# Patient Record
Sex: Female | Born: 1990 | Race: White | Hispanic: No | Marital: Single | Smoking: Never smoker
Health system: Southern US, Community
[De-identification: ages and names within clinical notes are randomized; demographics above are authoritative.]

## PROBLEM LIST (undated history)

## (undated) DIAGNOSIS — T50902A Poisoning by unspecified drugs, medicaments and biological substances, intentional self-harm, initial encounter: Secondary | ICD-10-CM

## (undated) DIAGNOSIS — L309 Dermatitis, unspecified: Secondary | ICD-10-CM

## (undated) DIAGNOSIS — G43909 Migraine, unspecified, not intractable, without status migrainosus: Secondary | ICD-10-CM

## (undated) DIAGNOSIS — F909 Attention-deficit hyperactivity disorder, unspecified type: Secondary | ICD-10-CM

## (undated) DIAGNOSIS — T7840XA Allergy, unspecified, initial encounter: Secondary | ICD-10-CM

## (undated) HISTORY — DX: Attention-deficit hyperactivity disorder, unspecified type: F90.9

## (undated) HISTORY — PX: TONSILLECTOMY: SUR1361

## (undated) HISTORY — PX: WISDOM TOOTH EXTRACTION: SHX21

## (undated) HISTORY — DX: Migraine, unspecified, not intractable, without status migrainosus: G43.909

## (undated) HISTORY — DX: Poisoning by unspecified drugs, medicaments and biological substances, intentional self-harm, initial encounter: T50.902A

## (undated) HISTORY — DX: Allergy, unspecified, initial encounter: T78.40XA

## (undated) HISTORY — DX: Dermatitis, unspecified: L30.9

---
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
## 2009-07-22 ENCOUNTER — Ambulatory Visit (HOSPITAL_BASED_OUTPATIENT_CLINIC_OR_DEPARTMENT_OTHER): Admission: RE | Admit: 2009-07-22 | Discharge: 2009-07-22 | Payer: Self-pay | Admitting: Oral Surgery

## 2013-02-15 ENCOUNTER — Ambulatory Visit (INDEPENDENT_AMBULATORY_CARE_PROVIDER_SITE_OTHER): Payer: 59 | Admitting: Family Medicine

## 2013-02-15 ENCOUNTER — Encounter: Payer: Self-pay | Admitting: Family Medicine

## 2013-02-15 ENCOUNTER — Telehealth: Payer: Self-pay | Admitting: Family Medicine

## 2013-02-15 VITALS — BP 100/68 | Temp 98.6°F | Ht 67.0 in | Wt 144.4 lb

## 2013-02-15 DIAGNOSIS — Z889 Allergy status to unspecified drugs, medicaments and biological substances status: Secondary | ICD-10-CM

## 2013-02-15 DIAGNOSIS — Z9109 Other allergy status, other than to drugs and biological substances: Secondary | ICD-10-CM

## 2013-02-15 DIAGNOSIS — G43109 Migraine with aura, not intractable, without status migrainosus: Secondary | ICD-10-CM

## 2013-02-15 MED ORDER — METHYLPREDNISOLONE ACETATE 40 MG/ML IJ SUSP
40.0000 mg | Freq: Once | INTRAMUSCULAR | Status: AC
  Administered 2013-02-15: 40 mg via INTRAMUSCULAR

## 2013-02-15 MED ORDER — SUMATRIPTAN SUCCINATE 50 MG PO TABS: 50.0000 mg | ORAL_TABLET | Freq: Once | ORAL | Status: DC | PRN

## 2013-02-15 MED ORDER — BUTALBITAL-APAP-CAFFEINE 50-325-40 MG PO TABS: 1.0000 | ORAL_TABLET | Freq: Four times a day (QID) | ORAL | Status: DC | PRN

## 2013-02-15 MED ORDER — ONDANSETRON HCL 4 MG PO TABS: 4.0000 mg | ORAL_TABLET | Freq: Every day | ORAL | Status: AC | PRN

## 2013-02-15 NOTE — Telephone Encounter (Signed)
DR Brett Canales refilled the wrong med for pt from her visit today with him, she needs Butal 50-325-40 Acetametaphon Caff called into [facility]

## 2013-02-15 NOTE — Progress Notes (Signed)
  Subjective:    Patient ID: Stephanie Mercado, female    DOB: 1991/07/18, 22 y.o.   MRN: [id number]  Migraine  This is a recurrent problem. The current episode started more than 1 year ago. The problem has been waxing and waning. The pain quality is similar to prior headaches. The quality of the pain is described as pulsating. The pain is at a severity of 6/10. The pain is moderate. Associated symptoms include nausea, phonophobia, photophobia, a visual change and vomiting. The symptoms are aggravated by activity.   Patient also notes recent allergy symptoms congestion drainage and stuffiness. Only fair response antihistamines. States an injection of steroids always helps her. And requests this.   Review of Systems  Eyes: Positive for photophobia.  Gastrointestinal: Positive for nausea and vomiting.       Objective:   Physical Exam Alert no acute distress. HEENT mild nasal congestion. Vital signs reviewed. Lungs clear. Heart regular rate and rhythm.       Assessment & Plan:  Impression #1 intermittent migraine headaches. Discussed. Very sporadic in nature. Response to medicine. #2 allergic rhinitis. Plan I initially asked if the patient wanted a refill on her Imitrex and she said yes. Later she called and stated she really meant the butalbital. This was also refilled. Steroid injection given. WSL

## 2013-02-15 NOTE — Telephone Encounter (Signed)
I asked pt if imitrex and she said yes. rx this med (butalb/acet/caffeine)with num 24, one po q4-6 prn headache. One refill

## 2013-02-28 ENCOUNTER — Telehealth: Payer: Self-pay | Admitting: Family Medicine

## 2013-02-28 NOTE — Telephone Encounter (Signed)
Pt wants to know if she could get some Allergy eye drops prescribed due to the pollen making her eyes itchy, watery and swell up.  Or does she need to go get something OTC?

## 2013-03-01 NOTE — Telephone Encounter (Signed)
Discussed with patient

## 2013-03-01 NOTE — Telephone Encounter (Signed)
It will probably just as cheap to buy Zaditor OTC.  It comes generic and works just as good as Rx meds.

## 2013-03-07 ENCOUNTER — Encounter: Payer: Self-pay | Admitting: *Deleted

## 2013-03-08 ENCOUNTER — Encounter: Payer: 59 | Admitting: Family Medicine

## 2013-09-20 ENCOUNTER — Other Ambulatory Visit: Payer: Self-pay | Admitting: Family Medicine

## 2013-09-20 NOTE — Telephone Encounter (Signed)
Ok may ref times one 

## 2013-12-09 ENCOUNTER — Other Ambulatory Visit: Payer: Self-pay | Admitting: Family Medicine

## 2013-12-09 NOTE — Telephone Encounter (Signed)
It is wise not to use these frequently. May have one refill. Will need followup office visit before further refills.

## 2014-03-31 ENCOUNTER — Encounter: Payer: Self-pay | Admitting: Family Medicine

## 2014-03-31 ENCOUNTER — Ambulatory Visit (INDEPENDENT_AMBULATORY_CARE_PROVIDER_SITE_OTHER): Payer: 59 | Admitting: Family Medicine

## 2014-03-31 VITALS — BP 120/74 | Ht 68.0 in | Wt 143.0 lb

## 2014-03-31 DIAGNOSIS — J309 Allergic rhinitis, unspecified: Secondary | ICD-10-CM | POA: Insufficient documentation

## 2014-03-31 DIAGNOSIS — J302 Other seasonal allergic rhinitis: Secondary | ICD-10-CM

## 2014-03-31 DIAGNOSIS — F988 Other specified behavioral and emotional disorders with onset usually occurring in childhood and adolescence: Secondary | ICD-10-CM | POA: Insufficient documentation

## 2014-03-31 MED ORDER — BUTALBITAL-APAP-CAFFEINE 50-325-40 MG PO TABS: ORAL_TABLET | ORAL | Status: DC

## 2014-03-31 MED ORDER — METHYLPREDNISOLONE ACETATE 40 MG/ML IJ SUSP
40.0000 mg | Freq: Once | INTRAMUSCULAR | Status: AC
  Administered 2014-03-31: 40 mg via INTRAMUSCULAR

## 2014-03-31 MED ORDER — AMPHETAMINE-DEXTROAMPHET ER 10 MG PO CP24: 10.0000 mg | ORAL_CAPSULE | Freq: Every day | ORAL | Status: DC

## 2014-03-31 NOTE — Progress Notes (Signed)
   Subjective:    Patient ID: Stephanie BookbinderLeah J Mercado, female    DOB: 19-Aug-1991, 23 y.o.   MRN: [id number]  HPIConcerns about ADD. Patient states she has trouble concertrating and being organized. Affecting her school work. This started years ago. Has never been diagnosed with ADD.   Requesting shot for allergies. Sneezing, congestion, itchy, and watery eyes.  Patient's had some problems with focus and ADD issues for a length of time it's been more pronounced recently since being in college. Has difficult time staying on track often finds herself jumping from one thing to do next and rarely finishing what she starts.    Review of Systems  Constitutional: Negative for fever, activity change, appetite change and fatigue.  HENT: Positive for rhinorrhea. Negative for congestion and ear pain.   Eyes: Negative for discharge.  Respiratory: Negative for cough, shortness of breath and wheezing.   Cardiovascular: Negative for chest pain.  Gastrointestinal: Negative for abdominal pain.  Neurological: Negative for headaches.  Psychiatric/Behavioral: Negative for behavioral problems.       Objective:   Physical Exam  Vitals reviewed. Constitutional: She appears well-nourished. No distress.  Cardiovascular: Normal rate, regular rhythm and normal heart sounds.   No murmur heard. Pulmonary/Chest: Effort normal and breath sounds normal. No respiratory distress.  Musculoskeletal: She exhibits no edema.  Lymphadenopathy:    She has no cervical adenopathy.  Neurological: She is alert. She exhibits normal muscle tone.  Psychiatric: Her behavior is normal.          Assessment & Plan:  #1 probable ADD. This been going on for sometime it hasn't caused much her problem until recently patient having difficult time staying following through in paying attention to detail.  #2 we discussed medications she opts to go ahead and start medicine Edison prescribed she will let us know if any problems otherwise followup  34 weeks to check blood pressure and response to medicine.  #3 allergies-OTC measures were discussed.  25 minutes spent in patient

## 2014-04-08 ENCOUNTER — Telehealth: Payer: Self-pay | Admitting: Family Medicine

## 2014-04-08 NOTE — Telephone Encounter (Signed)
Patient was prescribed amphetamine-dextroamphetamine (ADDERALL XR) 10 MG 24 hr capsule which was a lower dose ADD medication that she was originally on. She said that she doesn't feel like this is doing anything for her and can't tell she has taken anything. Please advise.

## 2014-04-09 NOTE — Telephone Encounter (Signed)
Pt has never been on adderall before. She was just saying that she does not feel any different. She does not believe the 10mg  is effective. Would like to know if she needs to give it more time or if the dose needs to be increased?

## 2014-04-09 NOTE — Telephone Encounter (Signed)
What I would do at this point is have her take 2 each morning and then she ought to let us know next week if this seems to be helping. If so we can do a prescription for 20 mg XR at that point.

## 2014-04-09 NOTE — Telephone Encounter (Signed)
Can I have her paper chart please? Thanks!

## 2014-04-09 NOTE — Telephone Encounter (Signed)
Nurses, check previous dose either in chart or ask pt. May need to prescribe new dose

## 2014-04-09 NOTE — Telephone Encounter (Signed)
Patient verbalized understanding of taking 2 each morning and then she ought to let us know next week if this seems to be helping. If so we can do a prescription for 20 mg XR at that point.

## 2014-04-15 ENCOUNTER — Telehealth: Payer: Self-pay | Admitting: Family Medicine

## 2014-04-15 MED ORDER — AMPHETAMINE-DEXTROAMPHET ER 20 MG PO CP24: 20.0000 mg | ORAL_CAPSULE | Freq: Every day | ORAL | Status: DC

## 2014-04-15 NOTE — Telephone Encounter (Signed)
She may have 2 prescriptions of Adderall XR 20 mg., #30 each. Recommend followup in approximately 2 months

## 2014-04-15 NOTE — Telephone Encounter (Signed)
[name] calling to state that the increased dose of Adderall is working well, needs Rx for Adderall XR 20mg , needs to pick up Wed or Thurs afternoon, will run out Saturday due to increasing dose and taking 2 of the 10mg   Please call pt when done

## 2014-04-16 NOTE — Telephone Encounter (Signed)
Left message on voicemail notifying patient scripts ready for pickup.  

## 2014-05-02 ENCOUNTER — Ambulatory Visit: Payer: 59 | Admitting: Family Medicine

## 2014-05-12 ENCOUNTER — Telehealth: Payer: Self-pay | Admitting: Family Medicine

## 2014-05-12 NOTE — Telephone Encounter (Signed)
Pt states she told the person she was speaking to on 04/15/14  That she would need to cancel her appt and call back later to reschedule She has now got a no show fee letter an wants to know if we can waive That fee this time?

## 2014-05-12 NOTE — Telephone Encounter (Signed)
Sure, waive fee

## 2014-05-13 NOTE — Telephone Encounter (Signed)
Done

## 2014-06-11 ENCOUNTER — Telehealth: Payer: Self-pay | Admitting: Family Medicine

## 2014-06-11 NOTE — Telephone Encounter (Signed)
Last seen 5/18

## 2014-06-11 NOTE — Telephone Encounter (Signed)
May fill early if [facility] willing

## 2014-06-11 NOTE — Telephone Encounter (Signed)
Pt needs refill on her amphetamine-dextroamphetamine (ADDERALL XR) 20 MG 24 hr capsule, due to refill on 06/15/14 but will be out of town for 1.5 weeks, leaving 06/13/14, can we write for her to get filled either before she leaves or fill while on vacation, please call when ready

## 2014-06-12 ENCOUNTER — Other Ambulatory Visit: Payer: Self-pay | Admitting: *Deleted

## 2014-06-12 MED ORDER — AMPHETAMINE-DEXTROAMPHET ER 20 MG PO CP24: 20.0000 mg | ORAL_CAPSULE | Freq: Every day | ORAL | Status: DC

## 2014-06-12 NOTE — Telephone Encounter (Signed)
rx ready for pickup. Pt notified 

## 2014-07-07 ENCOUNTER — Telehealth: Payer: Self-pay | Admitting: Family Medicine

## 2014-07-07 MED ORDER — AMPHETAMINE-DEXTROAMPHET ER 20 MG PO CP24: 20.0000 mg | ORAL_CAPSULE | Freq: Every day | ORAL | Status: DC

## 2014-07-07 NOTE — Telephone Encounter (Signed)
Patient notified script ready for pick up

## 2014-07-07 NOTE — Telephone Encounter (Signed)
May have 30 day prescription keep appointment in September

## 2014-07-07 NOTE — Telephone Encounter (Signed)
amphetamine-dextroamphetamine (ADDERALL XR) 20 MG 24 hr capsule  Has appt for sept 11th  Seen 04/10/14

## 2014-07-10 ENCOUNTER — Telehealth: Payer: Self-pay | Admitting: Family Medicine

## 2014-07-10 NOTE — Telephone Encounter (Signed)
Discussed with patient. Will call for an appointment tomorrow if symptoms are not better.

## 2014-07-10 NOTE — Telephone Encounter (Addendum)
Patient said that she went to urgent care out of town and she received a rocephin injection, because she had a bad kidney infection. She said that he whole back is sore and it feels like she got a tetanus shot. Also, she is having diarrhea and nausea. She wants to know if this is normal.

## 2014-07-12 ENCOUNTER — Emergency Department (HOSPITAL_COMMUNITY)
Admission: EM | Admit: 2014-07-12 | Discharge: 2014-07-12 | Disposition: A | Discharge status: Home / Self Care | Payer: 59 | Attending: Emergency Medicine | Admitting: Emergency Medicine

## 2014-07-12 ENCOUNTER — Encounter (HOSPITAL_COMMUNITY): Payer: Self-pay | Admitting: Emergency Medicine

## 2014-07-12 ENCOUNTER — Emergency Department (HOSPITAL_COMMUNITY): Payer: 59

## 2014-07-12 DIAGNOSIS — Z3202 Encounter for pregnancy test, result negative: Secondary | ICD-10-CM | POA: Diagnosis not present

## 2014-07-12 DIAGNOSIS — K5900 Constipation, unspecified: Secondary | ICD-10-CM | POA: Insufficient documentation

## 2014-07-12 DIAGNOSIS — Z872 Personal history of diseases of the skin and subcutaneous tissue: Secondary | ICD-10-CM | POA: Insufficient documentation

## 2014-07-12 DIAGNOSIS — N390 Urinary tract infection, site not specified: Secondary | ICD-10-CM | POA: Diagnosis not present

## 2014-07-12 DIAGNOSIS — Z8679 Personal history of other diseases of the circulatory system: Secondary | ICD-10-CM | POA: Diagnosis not present

## 2014-07-12 DIAGNOSIS — N200 Calculus of kidney: Secondary | ICD-10-CM | POA: Diagnosis not present

## 2014-07-12 DIAGNOSIS — R109 Unspecified abdominal pain: Secondary | ICD-10-CM | POA: Insufficient documentation

## 2014-07-12 DIAGNOSIS — Z79899 Other long term (current) drug therapy: Secondary | ICD-10-CM | POA: Diagnosis not present

## 2014-07-12 DIAGNOSIS — Z880 Allergy status to penicillin: Secondary | ICD-10-CM | POA: Diagnosis not present

## 2014-07-12 DIAGNOSIS — R6883 Chills (without fever): Secondary | ICD-10-CM | POA: Insufficient documentation

## 2014-07-12 LAB — URINALYSIS, ROUTINE W REFLEX MICROSCOPIC
GLUCOSE, UA: NEGATIVE mg/dL
Hgb urine dipstick: NEGATIVE
KETONES UR: 15 mg/dL — AB
NITRITE: POSITIVE — AB
PH: 5.5 (ref 5.0–8.0)
PROTEIN: 30 mg/dL — AB
Specific Gravity, Urine: 1.028 (ref 1.005–1.030)
UROBILINOGEN UA: 1 mg/dL (ref 0.0–1.0)

## 2014-07-12 LAB — URINE MICROSCOPIC-ADD ON

## 2014-07-12 LAB — POC URINE PREG, ED: Preg Test, Ur: NEGATIVE

## 2014-07-12 MED ORDER — FENTANYL CITRATE 0.05 MG/ML IJ SOLN
50.0000 ug | Freq: Once | INTRAMUSCULAR | Status: AC
  Administered 2014-07-12: 50 ug via INTRAVENOUS
  Filled 2014-07-12: qty 2

## 2014-07-12 MED ORDER — OXYCODONE-ACETAMINOPHEN 5-325 MG PO TABS: 2.0000 | ORAL_TABLET | ORAL | Status: DC | PRN

## 2014-07-12 MED ORDER — DEXTROSE 5 % IV SOLN
1.0000 g | Freq: Once | INTRAVENOUS | Status: AC
  Administered 2014-07-12: 1 g via INTRAVENOUS
  Filled 2014-07-12: qty 10

## 2014-07-12 MED ORDER — KETOROLAC TROMETHAMINE 30 MG/ML IJ SOLN
30.0000 mg | Freq: Once | INTRAMUSCULAR | Status: AC
  Administered 2014-07-12: 30 mg via INTRAVENOUS
  Filled 2014-07-12: qty 1

## 2014-07-12 MED ORDER — MORPHINE SULFATE 4 MG/ML IJ SOLN
4.0000 mg | Freq: Once | INTRAMUSCULAR | Status: AC
  Administered 2014-07-12: 4 mg via INTRAVENOUS
  Filled 2014-07-12: qty 1

## 2014-07-12 MED ORDER — SULFAMETHOXAZOLE-TMP DS 800-160 MG PO TABS: 1.0000 | ORAL_TABLET | Freq: Two times a day (BID) | ORAL | Status: DC

## 2014-07-12 MED ORDER — ONDANSETRON HCL 4 MG/2ML IJ SOLN
4.0000 mg | Freq: Once | INTRAMUSCULAR | Status: AC
  Administered 2014-07-12: 4 mg via INTRAVENOUS
  Filled 2014-07-12: qty 2

## 2014-07-12 MED ORDER — PROMETHAZINE HCL 25 MG PO TABS: 25.0000 mg | ORAL_TABLET | Freq: Four times a day (QID) | ORAL | Status: DC | PRN

## 2014-07-12 NOTE — ED Provider Notes (Signed)
CSN: [id number]     Arrival date & time 07/12/14  1534 History   First MD Initiated Contact with Patient 07/12/14 1650     Chief Complaint  Patient presents with  . Abdominal Pain  . Flank Pain     Patient is a 23 y.o. female presenting with flank pain. The history is provided by the patient.  Flank Pain This is a new problem. The current episode started more than 2 days ago. The problem occurs daily. The problem has been gradually worsening. Associated symptoms include abdominal pain. Pertinent negatives include no chest pain. Exacerbated by: certain positions. Nothing relieves the symptoms. Treatments tried: antibiotics. The treatment provided mild relief.  pt with bilateral flank pain for 6 days.  She reports seen in urgent care in [place], [place] 3 days ago, found to have pyelonephritis and given rocephin and daily cipro She had improvement of dysuria/hematuria, but reports flank pain has worsened and suprapubic discomfort has worsened No fever but reports chills She had diarrhea after meds She reports one episode of vomiting due to large pills she had to take No vag bleeding She has no other medical problems No abd surgery previously  Past Medical History  Diagnosis Date  . Migraines   . Allergy   . Eczema    Past Surgical History  Procedure Laterality Date  . Tonsillectomy     Family History  Problem Relation Age of Onset  . Cancer Mother   . Kidney disease Mother   . Diabetes Paternal Grandfather    History  Substance Use Topics  . Smoking status: Never Smoker   . Smokeless tobacco: Not on file  . Alcohol Use: Yes   OB History   Grav Para Term Preterm Abortions TAB SAB Ect Mult Living                 Review of Systems  Constitutional: Positive for chills.  Cardiovascular: Negative for chest pain.  Gastrointestinal: Positive for abdominal pain.  Genitourinary: Positive for flank pain. Negative for vaginal bleeding.  Neurological: Negative for weakness.  All  other systems reviewed and are negative.     Allergies  Penicillins  Home Medications   Prior to Admission medications   Medication Sig Start Date End Date Taking? Authorizing Provider  butalbital-acetaminophen-caffeine (FIORICET, ESGIC) 50-325-40 MG per tablet Take 1 tablet by mouth every 6 (six) hours as needed for headache.   Yes Historical Provider, MD  ciprofloxacin (CIPRO) 500 MG tablet Take 500 mg by mouth 2 (two) times daily. 07/10/14  Yes Historical Provider, MD  ibuprofen (ADVIL,MOTRIN) 200 MG tablet Take 800 mg by mouth 2 (two) times daily as needed (pain).   Yes Historical Provider, MD  levonorgestrel (MIRENA) 20 MCG/24HR IUD 1 each by Intrauterine route once.   Yes Historical Provider, MD  phenazopyridine (PYRIDIUM) 100 MG tablet Take 100 mg by mouth 3 (three) times daily as needed for pain.  07/10/14  Yes Historical Provider, MD  promethazine (PHENERGAN) 12.5 MG tablet Take 12.5 mg by mouth every 6 (six) hours as needed for nausea.  07/10/14  Yes Historical Provider, MD  amphetamine-dextroamphetamine (ADDERALL XR) 20 MG 24 hr capsule Take 1 capsule (20 mg total) by mouth daily. 07/07/14   Babs Sciara, MD   BP 118/90  Pulse 112  Temp(Src) 97.9 F (36.6 C) (Oral)  Resp 18  Ht  (1.702 m)  Wt 124 lb (56.246 kg)  BMI 19.42 kg/m2  SpO2 98% Physical Exam CONSTITUTIONAL: Well developed/well nourished  HEAD: Normocephalic/atraumatic EYES: EOMI/PERRL ENMT: Mucous membranes moist NECK: supple no meningeal signs SPINE:entire spine nontender CV: S1/S2 noted, no murmurs/rubs/gallops noted LUNGS: Lungs are clear to auscultation bilaterally, no apparent distress ABDOMEN: soft, nontender, no rebound or guarding JX:BJYN bilateral CVA tenderness NEURO: Pt is awake/alert, moves all extremitiesx4, equal power with bilateral hip flexion/knee flex/extension/ankle dorsi/plantar flexion EXTREMITIES: pulses normal, full ROM SKIN: warm, color normal PSYCH: no abnormalities of mood  noted  ED Course  Procedures 5:30 PM Labs from urgent care 3 days ago reveal obvious UTI She reports culture report was sensitive to cipro 6:39 PM Pt with continued pain Will perform ct imaging Will give dose of rocephin (She has had this before) 7:52 PM Ct findings noted, no acute process Pt stable, well appearing, not septic appearing Will switch meds to bactrim BP 138/94  Pulse 83  Temp(Src) 97.9 F (36.6 C) (Oral)  Resp 18  Ht  (1.702 m)  Wt 124 lb (56.246 kg)  BMI 19.42 kg/m2  SpO2 99%   Labs Review Labs Reviewed  URINALYSIS, ROUTINE W REFLEX MICROSCOPIC - Abnormal; Notable for the following:    Color, Urine RED (*)    APPearance TURBID (*)    Bilirubin Urine LARGE (*)    Ketones, ur 15 (*)    Protein, ur 30 (*)    Nitrite POSITIVE (*)    Leukocytes, UA SMALL (*)    All other components within normal limits  URINE MICROSCOPIC-ADD ON - Abnormal; Notable for the following:    Squamous Epithelial / LPF MANY (*)    Bacteria, UA FEW (*)    All other components within normal limits  URINE CULTURE  POC URINE PREG, ED    Imaging Review Ct Abdomen Pelvis Wo Contrast  07/12/2014   CLINICAL DATA:  Abdominal pain, low back pain. Bilateral flank pain.  EXAM: CT ABDOMEN AND PELVIS WITHOUT CONTRAST  TECHNIQUE: Multidetector CT imaging of the abdomen and pelvis was performed following the standard protocol without IV contrast.  COMPARISON:  None.  FINDINGS: Lung bases are clear.  No effusions.  Heart is normal size.  Liver, gallbladder, spleen, pancreas, adrenals are unremarkable.  Punctate 1-2 mm nonobstructing bilateral renal stones (1 in the midpole of the right kidney and 1 in the lower pole of the left kidney). No hydronephrosis. No visible ureteral stone. Calcifications in the pelvis felt represent phleboliths. No free fluid, free air or adenopathy.  IUD is in place within the uterus. No adnexal masses. Urinary bladder is decompressed.  Appendix is visualized and is  normal. Moderate to large stool burden throughout the colon, particularly right colon. Small bowel is decompressed. No free fluid, free air or adenopathy. Aorta is normal caliber.  No acute bony abnormality or low lesion.  IMPRESSION: Small punctate bilateral nonobstructing renal calculi. No ureteral stones or hydronephrosis.  Moderate stool burden, particularly in the right colon.   Electronically Signed   By: Charlett Nose M.D.   On: 07/12/2014 19:21      MDM   Final diagnoses:  UTI (lower urinary tract infection)  Kidney stone  Constipation, unspecified constipation type    Nursing notes including past medical history and social history reviewed and considered in documentation Labs/vital reviewed and considered Previous records reviewed and considered     Joya Gaskins, MD 07/12/14 [phone]

## 2014-07-12 NOTE — ED Notes (Signed)
Dr. Wickline at bedside.  

## 2014-07-12 NOTE — ED Notes (Signed)
Pt c/o abdominal pain, low back pain and B/l flank pain x 7 days. Pt recently diagnosed with kidney infection. Pt given prescribed medications.

## 2014-07-12 NOTE — ED Notes (Signed)
Patient transported to CT 

## 2014-07-13 ENCOUNTER — Encounter (HOSPITAL_COMMUNITY): Payer: Self-pay | Admitting: Emergency Medicine

## 2014-07-13 ENCOUNTER — Emergency Department (HOSPITAL_COMMUNITY)
Admission: EM | Admit: 2014-07-13 | Discharge: 2014-07-13 | Disposition: A | Discharge status: Home / Self Care | Payer: 59 | Attending: Emergency Medicine | Admitting: Emergency Medicine

## 2014-07-13 DIAGNOSIS — R000 Tachycardia, unspecified: Secondary | ICD-10-CM | POA: Insufficient documentation

## 2014-07-13 DIAGNOSIS — K5900 Constipation, unspecified: Secondary | ICD-10-CM | POA: Diagnosis present

## 2014-07-13 DIAGNOSIS — F411 Generalized anxiety disorder: Secondary | ICD-10-CM | POA: Insufficient documentation

## 2014-07-13 DIAGNOSIS — Z872 Personal history of diseases of the skin and subcutaneous tissue: Secondary | ICD-10-CM | POA: Diagnosis not present

## 2014-07-13 DIAGNOSIS — Z3202 Encounter for pregnancy test, result negative: Secondary | ICD-10-CM | POA: Insufficient documentation

## 2014-07-13 DIAGNOSIS — Z8679 Personal history of other diseases of the circulatory system: Secondary | ICD-10-CM | POA: Insufficient documentation

## 2014-07-13 DIAGNOSIS — Z880 Allergy status to penicillin: Secondary | ICD-10-CM | POA: Insufficient documentation

## 2014-07-13 DIAGNOSIS — Z792 Long term (current) use of antibiotics: Secondary | ICD-10-CM | POA: Insufficient documentation

## 2014-07-13 DIAGNOSIS — Z79899 Other long term (current) drug therapy: Secondary | ICD-10-CM | POA: Insufficient documentation

## 2014-07-13 LAB — URINALYSIS, ROUTINE W REFLEX MICROSCOPIC
GLUCOSE, UA: NEGATIVE mg/dL
Hgb urine dipstick: NEGATIVE
Ketones, ur: NEGATIVE mg/dL
LEUKOCYTES UA: NEGATIVE
Nitrite: NEGATIVE
PH: 5.5 (ref 5.0–8.0)
Protein, ur: NEGATIVE mg/dL
Specific Gravity, Urine: >1.03 — ABNORMAL HIGH (ref 1.005–1.030)
Urobilinogen, UA: 1 mg/dL (ref 0.0–1.0)

## 2014-07-13 LAB — PREGNANCY, URINE: Preg Test, Ur: NEGATIVE

## 2014-07-13 MED ORDER — PHENAZOPYRIDINE HCL 100 MG PO TABS
200.0000 mg | ORAL_TABLET | Freq: Once | ORAL | Status: AC
  Administered 2014-07-13: 200 mg via ORAL
  Filled 2014-07-13: qty 2

## 2014-07-13 NOTE — ED Provider Notes (Signed)
CSN: [id number]     Arrival date & time 07/13/14  1922 History   First MD Initiated Contact with Patient 07/13/14 1954     Chief Complaint  Patient presents with  . Constipation     (Consider location/radiation/quality/duration/timing/severity/associated sxs/prior Treatment) HPI Stephanie Mercado is a 23 y.o. female who presents to the ED with constipation. She was evaluated last night at [facility] ED and had a CT scan and told she was constipated in addition to having a UTI. The UTI symptoms are much improved but she is still constipated. She has taken several OTC medications without relief. Last BM was about a week ago. Patient feel like her abdomen is distended.   Past Medical History  Diagnosis Date  . Migraines   . Allergy   . Eczema    Past Surgical History  Procedure Laterality Date  . Tonsillectomy     Family History  Problem Relation Age of Onset  . Cancer Mother   . Kidney disease Mother   . Diabetes Paternal Grandfather    History  Substance Use Topics  . Smoking status: Never Smoker   . Smokeless tobacco: Not on file  . Alcohol Use: Yes   OB History   Grav Para Term Preterm Abortions TAB SAB Ect Mult Living                 Review of Systems Negative except as stated in HPI   Allergies  Penicillins  Home Medications   Prior to Admission medications   Medication Sig Start Date End Date Taking? Authorizing Provider  bisacodyl (DUCODYL) 5 MG EC tablet Take 5 mg by mouth daily as needed for mild constipation or moderate constipation.   Yes Historical Provider, MD  bisacodyl (DULCOLAX) 10 MG suppository Place 20 mg rectally as needed for moderate constipation.   Yes Historical Provider, MD  ibuprofen (ADVIL,MOTRIN) 200 MG tablet Take 800 mg by mouth 2 (two) times daily as needed (pain).   Yes Historical Provider, MD  magnesium citrate SOLN Take 296 mLs by mouth once.   Yes Historical Provider, MD  oxyCODONE-acetaminophen (PERCOCET/ROXICET) 5-325 MG per tablet  Take 2 tablets by mouth every 4 (four) hours as needed for severe pain. 07/12/14  Yes Joya Gaskins, MD  phenazopyridine (PYRIDIUM) 100 MG tablet Take 100 mg by mouth 3 (three) times daily as needed for pain.  07/10/14  Yes Historical Provider, MD  promethazine (PHENERGAN) 25 MG tablet Take 1 tablet (25 mg total) by mouth every 6 (six) hours as needed for nausea or vomiting. 07/12/14  Yes Joya Gaskins, MD  sodium phosphate (FLEET) 7-19 GM/118ML ENEM Place 1 enema rectally once.   Yes Historical Provider, MD  sulfamethoxazole-trimethoprim (BACTRIM DS) 800-160 MG per tablet Take 1 tablet by mouth 2 (two) times daily. 07/12/14  Yes Joya Gaskins, MD  amphetamine-dextroamphetamine (ADDERALL XR) 20 MG 24 hr capsule Take 1 capsule (20 mg total) by mouth daily. 07/07/14   Babs Sciara, MD  levonorgestrel (MIRENA) 20 MCG/24HR IUD 1 each by Intrauterine route once.    Historical Provider, MD   BP 136/97  Pulse 125  Temp(Src) 98.7 F (37.1 C) (Oral)  Resp 24  Ht  (1.702 m)  Wt 125 lb (56.7 kg)  BMI 19.57 kg/m2  SpO2 97% Physical Exam  Nursing note and vitals reviewed. Constitutional: She is oriented to person, place, and time. She appears well-developed and well-nourished.  HENT:  Head: Normocephalic.  Eyes: EOM are normal.  Neck: Neck supple.  Cardiovascular: Tachycardia present.   Pulmonary/Chest: Effort normal.  Abdominal: Soft. Bowel sounds are normal. She exhibits no distension and no mass. Tenderness: minimal tenderness LLQ. There is no rebound and no guarding.  Genitourinary: Rectal exam shows no external hemorrhoid, no internal hemorrhoid, no fissure, no mass, no tenderness and anal tone normal.  No stool palpated on rectal exam  Musculoskeletal: Normal range of motion.  Neurological: She is alert and oriented to person, place, and time. No cranial nerve deficit.  Skin: Skin is warm and dry.  Psychiatric: Her behavior is normal. Her mood appears anxious.    ED Course   Procedures (including critical care time) Labs Review Labs Reviewed  URINALYSIS, ROUTINE W REFLEX MICROSCOPIC - Abnormal; Notable for the following:    Color, Urine AMBER (*)    APPearance HAZY (*)    Specific Gravity, Urine >1.030 (*)    Bilirubin Urine LARGE (*)    All other components within normal limits  PREGNANCY, URINE  URINE MICROSCOPIC-ADD ON    Imaging Review Ct Abdomen Pelvis Wo Contrast  07/12/2014   CLINICAL DATA:  Abdominal pain, low back pain. Bilateral flank pain.  EXAM: CT ABDOMEN AND PELVIS WITHOUT CONTRAST  TECHNIQUE: Multidetector CT imaging of the abdomen and pelvis was performed following the standard protocol without IV contrast.  COMPARISON:  None.  FINDINGS: Lung bases are clear.  No effusions.  Heart is normal size.  Liver, gallbladder, spleen, pancreas, adrenals are unremarkable.  Punctate 1-2 mm nonobstructing bilateral renal stones (1 in the midpole of the right kidney and 1 in the lower pole of the left kidney). No hydronephrosis. No visible ureteral stone. Calcifications in the pelvis felt represent phleboliths. No free fluid, free air or adenopathy.  IUD is in place within the uterus. No adnexal masses. Urinary bladder is decompressed.  Appendix is visualized and is normal. Moderate to large stool burden throughout the colon, particularly right colon. Small bowel is decompressed. No free fluid, free air or adenopathy. Aorta is normal caliber.  No acute bony abnormality or low lesion.  IMPRESSION: Small punctate bilateral nonobstructing renal calculi. No ureteral stones or hydronephrosis.  Moderate stool burden, particularly in the right colon.   Electronically Signed   By: Charlett Nose M.D.   On: 07/12/2014 19:21   Results for orders placed during the hospital encounter of 07/13/14 (from the past 24 hour(s))  URINALYSIS, ROUTINE W REFLEX MICROSCOPIC     Status: Abnormal   Collection Time    07/13/14  8:15 PM      Result Value Ref Range   Color, Urine AMBER (*)  YELLOW   APPearance HAZY (*) CLEAR   Specific Gravity, Urine >1.030 (*) 1.005 - 1.030   pH 5.5  5.0 - 8.0   Glucose, UA NEGATIVE  NEGATIVE mg/dL   Hgb urine dipstick NEGATIVE  NEGATIVE   Bilirubin Urine LARGE (*) NEGATIVE   Ketones, ur NEGATIVE  NEGATIVE mg/dL   Protein, ur NEGATIVE  NEGATIVE mg/dL   Urobilinogen, UA 1.0  0.0 - 1.0 mg/dL   Nitrite NEGATIVE  NEGATIVE   Leukocytes, UA NEGATIVE  NEGATIVE  PREGNANCY, URINE     Status: None   Collection Time    07/13/14  8:15 PM      Result Value Ref Range   Preg Test, Ur NEGATIVE  NEGATIVE    Patient states she is staying with her mother here in [place] and left her pyridium at her house in [place]. She states maybe  that is why her lower abdomen is hurting. She request pyridium before she leaves.  MDM  23 y.o. female with constipation. Evaluated by Dr. Bebe Shaggy last night for abdominal pain and UTI. She had CT scan and labs during that visit. I discuss this case with Dr. Bebe Shaggy.  Discussed with the patient and her mother (who is a Engineer, civil (consulting)) clinical, lab and CT findings from previous visit. All questioned fully answered. She will follow up with her PCP or return here if any problems arise. Stable for discharge without further screening indicated at this time.  BP 136/97  Pulse 125  Temp(Src) 98.7 F (37.1 C) (Oral)  Resp 24  Ht  (1.702 m)  Wt 125 lb (56.7 kg)  BMI 19.57 kg/m2  SpO2 97%     Janne Napoleon, NP 07/14/14 [phone]

## 2014-07-13 NOTE — Discharge Instructions (Signed)
Dr. Bebe Shaggy and I have reviewed your CT scan form last night. You do not have an obstruction. You have good bowel sounds, your abdomen is soft. Follow up with Dr. Gerda Diss tomorrow for re evaluation. Be sure you are drinking plenty of fluids.   Constipation Constipation is when a person:  Poops (has a bowel movement) less than 3 times a week.  Has a hard time pooping.  Has poop that is dry, hard, or bigger than normal. HOME CARE   Eat foods with a lot of fiber in them. This includes fruits, vegetables, beans, and whole grains such as brown rice.  Avoid fatty foods and foods with a lot of sugar. This includes french fries, hamburgers, cookies, candy, and soda.  If you are not getting enough fiber from food, take products with added fiber in them (supplements).  Drink enough fluid to keep your pee (urine) clear or pale yellow.  Exercise on a regular basis, or as told by your doctor.  Go to the restroom when you feel like you need to poop. Do not hold it.  Only take medicine as told by your doctor. Do not take medicines that help you poop (laxatives) without talking to your doctor first. GET HELP RIGHT AWAY IF:   You have bright red blood in your poop (stool).  Your constipation lasts more than 4 days or gets worse.  You have belly (abdominal) or butt (rectal) pain.  You have thin poop (as thin as a pencil).  You lose weight, and it cannot be explained. MAKE SURE YOU:   Understand these instructions.  Will watch your condition.  Will get help right away if you are not doing well or get worse. Document Released: 04/18/2008 Document Revised: 11/05/2013 Document Reviewed: 08/12/2013 [facility] Patient Information 2015 [place], [place]. This information is not intended to replace advice given to you by your health care provider. Make sure you discuss any questions you have with your health care provider.

## 2014-07-13 NOTE — ED Notes (Signed)
Patient's mother states that her daughter took Mgcitrate and 2 rectal supp without relief .

## 2014-07-13 NOTE — ED Notes (Addendum)
Pt was seen yesterday at [facility] and was told she had a uti. Pt c/o constipation. Pt states she has taken 6 dulcolax, 1 bottle of mag citrate, 2 suppositories, and 1 enema with no relief. Pt states last normal bm was atleast a week and a half ago. Pt c/o lower back pain and abdominal pain.

## 2014-07-14 ENCOUNTER — Encounter: Payer: Self-pay | Admitting: Family Medicine

## 2014-07-14 ENCOUNTER — Other Ambulatory Visit: Payer: Self-pay | Admitting: Family Medicine

## 2014-07-14 ENCOUNTER — Ambulatory Visit (INDEPENDENT_AMBULATORY_CARE_PROVIDER_SITE_OTHER): Payer: 59 | Admitting: Family Medicine

## 2014-07-14 ENCOUNTER — Ambulatory Visit (HOSPITAL_COMMUNITY)
Admission: RE | Admit: 2014-07-14 | Discharge: 2014-07-14 | Disposition: A | Discharge status: Home / Self Care | Payer: 59 | Source: Ambulatory Visit | Attending: Family Medicine | Admitting: Family Medicine

## 2014-07-14 VITALS — BP 120/70 | Temp 98.3°F | Ht 68.0 in | Wt 123.4 lb

## 2014-07-14 DIAGNOSIS — R109 Unspecified abdominal pain: Secondary | ICD-10-CM

## 2014-07-14 DIAGNOSIS — K5900 Constipation, unspecified: Secondary | ICD-10-CM | POA: Diagnosis present

## 2014-07-14 LAB — URINE CULTURE
COLONY COUNT: NO GROWTH
CULTURE: NO GROWTH

## 2014-07-14 MED ORDER — ALPRAZOLAM 1 MG PO TABS: 1.0000 mg | ORAL_TABLET | Freq: Every evening | ORAL | Status: DC | PRN

## 2014-07-14 MED ORDER — PHENAZOPYRIDINE HCL 100 MG PO TABS: 100.0000 mg | ORAL_TABLET | Freq: Three times a day (TID) | ORAL | Status: DC | PRN

## 2014-07-14 NOTE — Progress Notes (Signed)
   Subjective:    Patient ID: Stephanie Mercado, female    DOB: June 08, 1991, 23 y.o.   MRN: [id number]  Constipation This is a new problem. The current episode started 1 to 4 weeks ago. The problem is unchanged. Associated symptoms include abdominal pain. She has tried laxatives, enemas and stool softeners for the symptoms. The treatment provided no relief.  Patient has no had a BM in 1 1/2 weeks.  Patient was also treated on Saturday at [facility] ER and diagnosed with a UTI. Patient has been taking Bactrim DS for the UTI but she is concerned mainly about her constipation and the fact that she is unable to have a BM.   She's never had this before she is on Bactrim for UTI  Review of Systems  Gastrointestinal: Positive for abdominal pain and constipation.   she denies fever vomiting she denies bloody stools     Objective:   Physical Exam Her lungs are clear heart is regular pulses normal abdomen is soft no guarding or rebound subjective soreness throughout the abdomen no point tenderness no guarding or rebound extremities no edema       Assessment & Plan:  Abdominal pain I believe she is having emotional constipation related to recent infection as well as opioid use stay away from all pain medicine use stool softeners and Senokot and stimulation. We will do x-rays and followup on the results of this. If profuse vomiting severe pain or worse go to the ER.  She admits to being very nervous she requested some nerve medication use at nighttime to help rest  X-rays came back showed some air-fluid levels and gas-like sensation in the intestines nothing emergent. I spoke with the patient by phone she was feeling better she was eating she not passed gas yet. I told her to use the MiraLax and to try walking around someone sitting on the toilet at least once or twice a day she should call as tomorrow with an update. May have to get gastroenterology involved. If she gets worse go to the ER.   She states  that a significant amount of weight she's lost it since it was not because. Weight is 100 and. She will be following up in a few weeks to recheck her weight

## 2014-07-15 ENCOUNTER — Telehealth: Payer: Self-pay | Admitting: Family Medicine

## 2014-07-15 NOTE — Telephone Encounter (Signed)
Nurses he spoke with the patient. Falling down a lot she is doing. I saw her yesterday for severe constipation. I message regarding her x-ray. I spoke with patient last night. Finally now is she having nausea? Vomiting? Abdominal pain? Passing gas? Having a bowel movement? Thank you

## 2014-07-15 NOTE — Telephone Encounter (Signed)
She is passing gas now, no bowel movement but feels like its  Starting to work.   Call pt if you have an questions.   Still some what bloated, but nothing new than yesterday.

## 2014-07-15 NOTE — ED Provider Notes (Signed)
Medical screening examination/treatment/procedure(s) were performed by non-physician practitioner and as supervising physician I was immediately available for consultation/collaboration.   EKG Interpretation None        Psalm Arman W Katelyn Broadnax, MD 07/15/14 1219 

## 2014-07-15 NOTE — Telephone Encounter (Signed)
Patient is doing better. Has had several liquidy stools today. She relates passing a lot of gas earlier today. She is feeling better she her appetite is picking up. She will go ahead with proper eating and healthy E. being alone with fiber she will avoid any pain medicine and she will followup later in September for her ADD sooner if problems

## 2014-07-15 NOTE — Telephone Encounter (Signed)
Patient said that she went to the bathroom and had diarrhea two times.

## 2014-07-15 NOTE — Telephone Encounter (Signed)
As per your request, pt calling back to give you an update

## 2014-07-15 NOTE — Telephone Encounter (Signed)
No nausea, no vomiting, she is having abd pain but pt thinks its from gas. She is passing gas and she has had two bowel movements today. She states its diarrhea but has some firmness to it. She is feeling better today than yesterday.

## 2014-07-17 ENCOUNTER — Telehealth: Payer: Self-pay | Admitting: Family Medicine

## 2014-07-17 NOTE — Telephone Encounter (Signed)
Patient said that all her bathroom issues have gone away, and she has taken all of her prescribed oxycodone that the ER gave her, because she was in so much pain.  She wants to know if we can either write her another Rx for this medication, or give her something else for pain. Please advise.

## 2014-07-17 NOTE — Telephone Encounter (Signed)
Given #15 percocet 8/29 for Constipation - In Dr. Roby Lofts not it says avoid all pain meds

## 2014-07-17 NOTE — Telephone Encounter (Signed)
Advised patient that if her pain is at a point of needing further narcotics then she needed to go to [facility] ER-per Dr. Brett Canales Patient verbalized understanding.

## 2014-07-25 ENCOUNTER — Encounter: Payer: Self-pay | Admitting: Family Medicine

## 2014-07-25 ENCOUNTER — Ambulatory Visit (INDEPENDENT_AMBULATORY_CARE_PROVIDER_SITE_OTHER): Payer: 59 | Admitting: Family Medicine

## 2014-07-25 VITALS — BP 110/82 | Ht 68.0 in | Wt 122.6 lb

## 2014-07-25 DIAGNOSIS — L989 Disorder of the skin and subcutaneous tissue, unspecified: Secondary | ICD-10-CM

## 2014-07-25 DIAGNOSIS — F988 Other specified behavioral and emotional disorders with onset usually occurring in childhood and adolescence: Secondary | ICD-10-CM

## 2014-07-25 MED ORDER — AMPHETAMINE-DEXTROAMPHET ER 20 MG PO CP24: 20.0000 mg | ORAL_CAPSULE | Freq: Every day | ORAL | Status: DC

## 2014-07-25 MED ORDER — BUTALBITAL-APAP-CAFFEINE 50-325-40 MG PO TABS: 1.0000 | ORAL_TABLET | ORAL | Status: DC | PRN

## 2014-07-25 NOTE — Progress Notes (Signed)
   Subjective:    Patient ID: Domenick Bookbinder, female    DOB: [birth year], 23 y.o.   MRN: [id number]  HPI Patient arrives for an ADHD check up- currently on Adderall XR  daily. Patient would also like mole on her stomach checked. She states that she does a good job watching how well she she takes her medicine states she is not having problems  Review of Systems She denies nausea vomiting diarrhea chest pain states her appetite is doing good.    Objective:   Physical Exam Mole stomach there are 2 mol present one is darker than the other but there is no irregularity r shoulder has a benign appearance. and mid back has a lesion approximately 1 cm x 1.5 cm that she states she's had for years. He does not appear to be cancerous. Because of its size it is at higher risk of becoming cancerous. Lungs clear heart regular       Assessment & Plan:  ADHD-the importance of taking medicine discussed. The importance of working hard on the tracing discussed ideal weight would be closer to 1:30. She is to followup in 3 months.  She has multiple benign moles the one on the abdomen is darker but I do not find any irregularity. Warning signs were discussed. The one on her back appears benign she states it's been the current size for a long time. We will recheck these moles periodically every 6 months

## 2014-07-27 ENCOUNTER — Telehealth: Payer: Self-pay | Admitting: Family Medicine

## 2014-07-27 DIAGNOSIS — O019 Hydatidiform mole, unspecified: Secondary | ICD-10-CM

## 2014-07-27 NOTE — Telephone Encounter (Signed)
Nurses, call the patient let her know I have given additional thought regarding the area on her back. Tell the pt: although I dont believe this is cancer I do believe it would be in her best interest to see dermatology. Find out from pt if she has preference or would like for [place] to set this up. (she showed this to  Me last week at her ov)

## 2014-07-28 NOTE — Addendum Note (Signed)
Addended by: Margaretha Sheffield on: 07/28/2014 08:40 AM   Modules accepted: Orders

## 2014-07-28 NOTE — Telephone Encounter (Signed)
Discussed with patient. Referral to dermatology placed in EPIC.

## 2014-09-12 ENCOUNTER — Telehealth: Payer: Self-pay | Admitting: Family Medicine

## 2014-09-12 MED ORDER — ALPRAZOLAM 0.5 MG PO TABS: 0.5000 mg | ORAL_TABLET | Freq: Every evening | ORAL | Status: DC | PRN

## 2014-09-12 NOTE — Telephone Encounter (Signed)
Xanax 0.5 mg one qhs prn , 30, 0 refills

## 2014-09-12 NOTE — Telephone Encounter (Signed)
Script faxed to pharmacy. Patient was notified.  

## 2014-09-12 NOTE — Telephone Encounter (Signed)
Patient was recently prescribed xanax to help her sleep for an issue that she was having with abdominal pain.  She said that recently 2 people in her family have been diagnosed with breast cancer and she has been dealing with college. She would like to know if there is any way we can give her a refill of her xanax again to help her sleep during this time.    [facility]

## 2014-09-16 ENCOUNTER — Ambulatory Visit: Payer: 59 | Admitting: Family Medicine

## 2014-09-17 DIAGNOSIS — Z029 Encounter for administrative examinations, unspecified: Secondary | ICD-10-CM

## 2014-10-02 ENCOUNTER — Telehealth: Payer: Self-pay | Admitting: Family Medicine

## 2014-10-02 NOTE — Telephone Encounter (Signed)
Patient has an Rx for Adderral that is due to be filled on 10/09/2014.  She is going out of town the night of 10/08/2014 and wants to know if her Rx can be changed for her to fill this day? [facility] will not fill this in advance without permission.   Also, she wants Dr. Lorin PicketScott to know that the Xanax that she was prescribed for .5 mg did not work as well as Xanax 1 mg that she was prescribed previously.  She said that she did not feel anything on the .5 mg.

## 2014-10-02 NOTE — Telephone Encounter (Signed)
#  1 nurses please clarify with this patient how she is using the Xanax currently. #2 she may have a re-issuing of the Adderall prescription to be filled on the 25th

## 2014-10-03 ENCOUNTER — Ambulatory Visit: Payer: 59 | Admitting: Nurse Practitioner

## 2014-10-03 MED ORDER — ALPRAZOLAM 1 MG PO TABS: 1.0000 mg | ORAL_TABLET | Freq: Every evening | ORAL | Status: DC | PRN

## 2014-10-03 MED ORDER — AMPHETAMINE-DEXTROAMPHET ER 20 MG PO CP24: 20.0000 mg | ORAL_CAPSULE | Freq: Every day | ORAL | Status: DC

## 2014-10-03 NOTE — Telephone Encounter (Signed)
Xanax 1 mg, #30, one daily at bedtime when necessary sleep, 2 refills, if possible patient should try not to use this every single night. Finally should consider following up in early spring.

## 2014-10-03 NOTE — Telephone Encounter (Signed)
Patient takes the xanax as needed whenever her stress level is high and needs help with sleeping. She said taking the 1 tablet (.5mg ) is not helping her with sleep. She would like to go back to the 1mg  tablet.

## 2014-10-03 NOTE — Telephone Encounter (Signed)
Patient notified and verbalized understanding. 

## 2014-10-20 ENCOUNTER — Ambulatory Visit: Payer: 59 | Admitting: Family Medicine

## 2014-10-22 ENCOUNTER — Encounter: Payer: Self-pay | Admitting: Family Medicine

## 2014-11-05 ENCOUNTER — Ambulatory Visit (INDEPENDENT_AMBULATORY_CARE_PROVIDER_SITE_OTHER): Payer: 59 | Admitting: Family Medicine

## 2014-11-05 ENCOUNTER — Encounter: Payer: Self-pay | Admitting: Family Medicine

## 2014-11-05 VITALS — BP 122/78 | Ht 68.0 in | Wt 124.6 lb

## 2014-11-05 DIAGNOSIS — F988 Other specified behavioral and emotional disorders with onset usually occurring in childhood and adolescence: Secondary | ICD-10-CM

## 2014-11-05 DIAGNOSIS — J0100 Acute maxillary sinusitis, unspecified: Secondary | ICD-10-CM

## 2014-11-05 DIAGNOSIS — F909 Attention-deficit hyperactivity disorder, unspecified type: Secondary | ICD-10-CM

## 2014-11-05 MED ORDER — AMPHETAMINE-DEXTROAMPHET ER 20 MG PO CP24: 20.0000 mg | ORAL_CAPSULE | Freq: Every day | ORAL | Status: DC

## 2014-11-05 MED ORDER — AZITHROMYCIN 250 MG PO TABS: ORAL_TABLET | ORAL | Status: DC

## 2014-11-05 MED ORDER — BUTALBITAL-APAP-CAFFEINE 50-325-40 MG PO TABS: 1.0000 | ORAL_TABLET | ORAL | Status: DC | PRN

## 2014-11-05 MED ORDER — BENZONATATE 100 MG PO CAPS: 100.0000 mg | ORAL_CAPSULE | Freq: Four times a day (QID) | ORAL | Status: DC | PRN

## 2014-11-05 NOTE — Patient Instructions (Signed)

## 2014-11-05 NOTE — Progress Notes (Signed)
   Subjective:    Patient ID: Stephanie BookbinderLeah J Zoeller, female    DOB: Nov 13, 1991, 23 y.o.   MRN: [id number]  HPI Patient arrives for a follow up on ADHD. Discuss dosage- not working as well. Long discussion was held with the patient regarding the medication. My main concern is her weight stays around 124 she states that she is eating healthy and on a regular basis. She states her usual weight is near 1:30. She also states at times she feels slightly wound up in the evening time. I told her it would not be in her best interest to increase the dose of her medicine. She denies being depressed.  She does relate mild upper respiratory illness with head congestion sinus pressure drainage and coughing   Review of Systems  Constitutional: Negative for fever, activity change, appetite change and fatigue.  HENT: Positive for congestion and rhinorrhea. Negative for ear pain.   Eyes: Negative for discharge.  Respiratory: Positive for cough. Negative for shortness of breath and wheezing.   Cardiovascular: Negative for chest pain.  Gastrointestinal: Negative for abdominal pain.  Neurological: Negative for headaches.  Psychiatric/Behavioral: Negative for behavioral problems.       Objective:   Physical Exam  Constitutional: She appears well-developed and well-nourished. No distress.  HENT:  Head: Normocephalic.  Nose: Nose normal.  Mouth/Throat: Oropharynx is clear and moist. No oropharyngeal exudate.  Neck: Neck supple.  Cardiovascular: Normal rate, regular rhythm and normal heart sounds.   No murmur heard. Pulmonary/Chest: Effort normal and breath sounds normal. No respiratory distress. She has no wheezes.  Musculoskeletal: She exhibits no edema.  Lymphadenopathy:    She has no cervical adenopathy.  Neurological: She is alert. She exhibits normal muscle tone.  Skin: Skin is warm and dry.  Psychiatric: Her behavior is normal.  Nursing note and vitals reviewed.         Assessment & Plan:

## 2014-12-24 ENCOUNTER — Other Ambulatory Visit: Payer: Self-pay | Admitting: Family Medicine

## 2014-12-25 NOTE — Telephone Encounter (Signed)
May refill this +3 additional refills 

## 2015-01-12 ENCOUNTER — Other Ambulatory Visit: Payer: Self-pay | Admitting: Family Medicine

## 2015-01-12 NOTE — Telephone Encounter (Signed)
Last seen 11/05/14.

## 2015-01-12 NOTE — Telephone Encounter (Signed)
1 refill needs office visit in the spring

## 2015-01-26 ENCOUNTER — Encounter: Payer: Self-pay | Admitting: Family Medicine

## 2015-01-26 ENCOUNTER — Ambulatory Visit (INDEPENDENT_AMBULATORY_CARE_PROVIDER_SITE_OTHER): Payer: 59 | Admitting: Family Medicine

## 2015-01-26 DIAGNOSIS — H6011 Cellulitis of right external ear: Secondary | ICD-10-CM | POA: Diagnosis not present

## 2015-01-26 DIAGNOSIS — H6001 Abscess of right external ear: Secondary | ICD-10-CM | POA: Diagnosis not present

## 2015-01-26 MED ORDER — HYDROCODONE-ACETAMINOPHEN 7.5-325 MG PO TABS: 1.0000 | ORAL_TABLET | Freq: Four times a day (QID) | ORAL | Status: DC | PRN

## 2015-01-26 MED ORDER — DOXYCYCLINE HYCLATE 100 MG PO CAPS: 100.0000 mg | ORAL_CAPSULE | Freq: Two times a day (BID) | ORAL | Status: DC

## 2015-01-26 NOTE — Progress Notes (Signed)
   Subjective:    Patient ID: Stephanie BookbinderLeah J Mercado, female    DOB: 12-07-1990, 24 y.o.   MRN: [id number]  HPI This patient had an onset of right ear pain over the past 3-4 days now worse. Relates it became infected where a ear ring was. There is no foreign body present in there no fever. Pain is intense weights at a 9 out of 10 has never had this problem before   Review of Systems See above    Objective:   Physical Exam  Right ear cellulitis is noted with a small abscess behind the year. Ear ring itself that is above that area is intact. With patient's consent 1% lidocaine and a #11 blade was used small amount of pus obtained skin culture was sent      Assessment & Plan:  Cellulitis small abscess dockside can twice a day 10 days warm compresses frequently pain medication as necessary cautioned drowsiness recheck in approximately 8 days remove the other ear ring for now until her situation better patient understands to do all of this and call us if problems

## 2015-01-28 LAB — WOUND CULTURE

## 2015-01-28 NOTE — Progress Notes (Signed)
Patient notified and verbalized understanding of the test results.  

## 2015-01-29 ENCOUNTER — Telehealth: Payer: Self-pay | Admitting: Family Medicine

## 2015-01-29 NOTE — Telephone Encounter (Signed)
Let pt know scott will see this tomorrow morn

## 2015-01-29 NOTE — Telephone Encounter (Signed)
Stop doxycycline. Prescribed Bactrim DS 1 twice a day for 10 days. Take other ear ring out. Recommend follow-up office visit 10 days to recheck the year. Oxycodone 5 mg/325 mg, #20, one every or hours when necessary severe pain use sparingly. Follow-up within 7-10 days thank you sooner problems.

## 2015-01-29 NOTE — Telephone Encounter (Signed)
Pt states doxycycline (VIBRAMYCIN) 100 MG capsule is making her nauseated and wonders if there's another antibiotic we could give her?  Also ear piercing above infection seems to be getting infected too,  Taking Benedryl also due to the pain meds given HYDROcodone-acetaminophen (NORCO) 7.5-325 MG per tablet are making her itchy. States that has taken oxycodone in the past with no trouble.  Suggestions?

## 2015-01-30 ENCOUNTER — Other Ambulatory Visit: Payer: Self-pay | Admitting: *Deleted

## 2015-01-30 MED ORDER — SULFAMETHOXAZOLE-TRIMETHOPRIM 800-160 MG PO TABS: 1.0000 | ORAL_TABLET | Freq: Two times a day (BID) | ORAL | Status: DC

## 2015-01-30 MED ORDER — OXYCODONE-ACETAMINOPHEN 5-325 MG PO TABS: 1.0000 | ORAL_TABLET | ORAL | Status: DC | PRN

## 2015-01-30 NOTE — Telephone Encounter (Signed)
Discussed with pt. Antibiotic sent to pharm. Oxy ready for pickup. Pt states she has a follow up next week.

## 2015-02-02 ENCOUNTER — Telehealth: Payer: Self-pay | Admitting: Family Medicine

## 2015-02-02 MED ORDER — AMPHETAMINE-DEXTROAMPHET ER 20 MG PO CP24: 20.0000 mg | ORAL_CAPSULE | Freq: Every day | ORAL | Status: DC

## 2015-02-02 NOTE — Telephone Encounter (Signed)
Pt would like to cancel her appt and go ahead with getting her scripts  Today if at all possible. She will be out of town tomorrow and not any where [facility] the next day.   Please advise   She will run out on Wed of her adderrall

## 2015-02-02 NOTE — Telephone Encounter (Signed)
Per Dr. Lorin PicketScott- 2 scripts for Adderall were printed and needs to schedule office visit in May. Patient verbalized understanding.

## 2015-02-03 ENCOUNTER — Ambulatory Visit: Payer: 59 | Admitting: Family Medicine

## 2015-03-02 ENCOUNTER — Ambulatory Visit (INDEPENDENT_AMBULATORY_CARE_PROVIDER_SITE_OTHER): Payer: 59 | Admitting: Family Medicine

## 2015-03-02 ENCOUNTER — Encounter: Payer: Self-pay | Admitting: Family Medicine

## 2015-03-02 VITALS — BP 110/76 | Temp 99.1°F | Ht 68.0 in | Wt 140.0 lb

## 2015-03-02 DIAGNOSIS — H6012 Cellulitis of left external ear: Secondary | ICD-10-CM | POA: Diagnosis not present

## 2015-03-02 DIAGNOSIS — H6092 Unspecified otitis externa, left ear: Secondary | ICD-10-CM

## 2015-03-02 MED ORDER — OFLOXACIN 0.3 % OT SOLN: 10.0000 [drp] | Freq: Every day | OTIC | Status: AC

## 2015-03-02 MED ORDER — DOXYCYCLINE HYCLATE 100 MG PO CAPS: 100.0000 mg | ORAL_CAPSULE | Freq: Two times a day (BID) | ORAL | Status: DC

## 2015-03-02 MED ORDER — PROMETHAZINE HCL 25 MG PO TABS: 25.0000 mg | ORAL_TABLET | Freq: Three times a day (TID) | ORAL | Status: DC | PRN

## 2015-03-02 MED ORDER — HYDROCODONE-ACETAMINOPHEN 7.5-325 MG PO TABS: 1.0000 | ORAL_TABLET | Freq: Four times a day (QID) | ORAL | Status: DC | PRN

## 2015-03-02 NOTE — Progress Notes (Signed)
   Subjective:    Patient ID: Domenick BookbinderLeah J Horsley, female    DOB: June 09, 1991, 24 y.o.   MRN: [id number]  Otalgia  There is pain in the left ear. Episode onset: 4 days ago. Associated symptoms include headaches and vomiting. She has tried acetaminophen and NSAIDs (peroxide, hydrocodone) for the symptoms.   Patient relates start off left ear pain then some tenderness pain discomfort she is tried over-the-counter measures without much success denies high fever chills or sweats   Review of Systems  HENT: Positive for ear pain.   Gastrointestinal: Positive for vomiting.  Neurological: Positive for headaches.       Objective:   Physical Exam  Left otitis externa noted also some cellulitis of the left ear no abscess noted neck is supple throat is normal lungs clear heart regular      Assessment & Plan:  Left otitis externa along with cellulitis dockside can twice a day 10 days had a recent history of MRSA should help get her better take it with food until glass of water also pain medication prescribed paced states if she takes Benadryl with it does not cause itching does not cause hives she has tried Percocet for this actually cause worse problems she was warned that if she starts having high fevers or persistent pain over the next few days follow-up immediately sooner if any problems.

## 2015-04-01 ENCOUNTER — Other Ambulatory Visit: Payer: Self-pay | Admitting: *Deleted

## 2015-04-01 ENCOUNTER — Telehealth: Payer: Self-pay | Admitting: Family Medicine

## 2015-04-01 MED ORDER — AMPHETAMINE-DEXTROAMPHET ER 20 MG PO CP24: 20.0000 mg | ORAL_CAPSULE | Freq: Every day | ORAL | Status: DC

## 2015-04-01 NOTE — Telephone Encounter (Signed)
Script ready for pickup. Pt notified.  

## 2015-04-01 NOTE — Telephone Encounter (Signed)
Please verify with her pharmacy when her last prescription was filled she may have one additional prescription of this medicine.

## 2015-04-01 NOTE — Telephone Encounter (Signed)
amphetamine-dextroamphetamine (ADDERALL XR) 20 MG 24 hr capsule  Pt states she is unsure what she did with her script to have filled  May 20th?   Can she get another one? She is about out.  Call when ready

## 2015-04-01 NOTE — Telephone Encounter (Signed)
Last filled at [facility] on 4/20

## 2015-04-14 ENCOUNTER — Other Ambulatory Visit: Payer: Self-pay | Admitting: Family Medicine

## 2015-04-14 NOTE — Telephone Encounter (Signed)
May have this +3 refills 

## 2015-04-27 ENCOUNTER — Telehealth: Payer: Self-pay | Admitting: Family Medicine

## 2015-04-27 MED ORDER — AMPHETAMINE-DEXTROAMPHET ER 20 MG PO CP24: 20.0000 mg | ORAL_CAPSULE | Freq: Every day | ORAL | Status: DC

## 2015-04-27 NOTE — Telephone Encounter (Signed)
Pt will need a refill on her adderall by the 19th pt has an appt scheduled for the 27th.

## 2015-04-27 NOTE — Telephone Encounter (Signed)
Ok times one 

## 2015-04-28 NOTE — Telephone Encounter (Signed)
[facility] (voicemail box full). Script ready for pickup.

## 2015-05-11 ENCOUNTER — Encounter: Payer: 59 | Admitting: Family Medicine

## 2015-05-11 DIAGNOSIS — Z029 Encounter for administrative examinations, unspecified: Secondary | ICD-10-CM

## 2015-05-26 ENCOUNTER — Ambulatory Visit (INDEPENDENT_AMBULATORY_CARE_PROVIDER_SITE_OTHER): Payer: 59 | Admitting: Family Medicine

## 2015-05-26 ENCOUNTER — Encounter: Payer: Self-pay | Admitting: Family Medicine

## 2015-05-26 VITALS — Ht 68.0 in | Wt 140.0 lb

## 2015-05-26 DIAGNOSIS — M79675 Pain in left toe(s): Secondary | ICD-10-CM

## 2015-05-26 DIAGNOSIS — F988 Other specified behavioral and emotional disorders with onset usually occurring in childhood and adolescence: Secondary | ICD-10-CM

## 2015-05-26 DIAGNOSIS — F909 Attention-deficit hyperactivity disorder, unspecified type: Secondary | ICD-10-CM

## 2015-05-26 DIAGNOSIS — G43109 Migraine with aura, not intractable, without status migrainosus: Secondary | ICD-10-CM | POA: Diagnosis not present

## 2015-05-26 MED ORDER — AMPHETAMINE-DEXTROAMPHET ER 20 MG PO CP24: 20.0000 mg | ORAL_CAPSULE | Freq: Every day | ORAL | Status: DC

## 2015-05-26 MED ORDER — BUTALBITAL-APAP-CAFFEINE 50-325-40 MG PO TABS: ORAL_TABLET | ORAL | Status: DC

## 2015-05-26 MED ORDER — DOXYCYCLINE HYCLATE 100 MG PO CAPS: 100.0000 mg | ORAL_CAPSULE | Freq: Two times a day (BID) | ORAL | Status: DC

## 2015-05-26 NOTE — Progress Notes (Signed)
   Subjective:    Patient ID: Stephanie BookbinderLeah J Mercado, female    DOB: 1991/01/08, 24 y.o.   MRN: [id number]  HPI Patient arrives to check left foot and big toe-turned it over this weekend. Patient is going to go camping over the next several weeks she is afraid that if it gets infected she will have a difficult time she states her foot is sore when she walks she was barefoot when this occurred it got stuck in the sidewalk. She is able to walk on it but the toe was sore and raw throbs  She also has migraines requests refill of her migraine medicine she states they've been stable  She also states she has ADD the medication benefits her she would like to have 2 additional months until she can get back into be seen she states she is not having any problems.  Review of Systems Patient has history of MRSA denies high fever chills sweats denies nausea vomiting    Objective:   Physical Exam Lungs clear hearts regular pulse normal weight is noted blood pressure noted Patient has abrasion on the outer aspect of her left ankle she also has deep avulsions great to the great toe on that side no infection currently  There is no sign of cellulitis     Assessment & Plan:  Toe avulsion this will take some time to get better ibuprofen for pain also recommendOrabase B as necessary to help numb it plus in addition to this a prescription for doxycycline if he gets worse she is to get this filled avoid sunlight take with food  ADD 2 prescriptions medicine given she is to follow-up in approximately 2 months  Migraines-refill her migraine medicine she was cautioned not to overuse it.

## 2015-05-27 NOTE — Progress Notes (Signed)
Pt coming in tomorrow for tetanus vaccine

## 2015-05-28 ENCOUNTER — Ambulatory Visit: Payer: Commercial Managed Care - HMO

## 2015-06-19 ENCOUNTER — Telehealth: Payer: Self-pay | Admitting: Family Medicine
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
## 2015-06-19 NOTE — Telephone Encounter (Signed)
Notified patient unfortunately nothing can be done about this. This type of medication requires a written prescription. She will need to function without the medication. When she gets back to [place] she can get a new prescription if need be

## 2015-06-19 NOTE — Telephone Encounter (Signed)
Unfortunately nothing can be done about this. This type of medication requires a written prescription. She will need to function without the medication. When she gets back to [place] she can get a new prescription if need be

## 2015-07-01 ENCOUNTER — Encounter: Payer: Self-pay | Admitting: Family Medicine

## 2015-07-01 ENCOUNTER — Ambulatory Visit (INDEPENDENT_AMBULATORY_CARE_PROVIDER_SITE_OTHER): Payer: Commercial Managed Care - HMO | Admitting: Family Medicine

## 2015-07-01 VITALS — Temp 98.7°F | Ht 68.0 in | Wt 129.0 lb

## 2015-07-01 DIAGNOSIS — F909 Attention-deficit hyperactivity disorder, unspecified type: Secondary | ICD-10-CM | POA: Diagnosis not present

## 2015-07-01 DIAGNOSIS — H6092 Unspecified otitis externa, left ear: Secondary | ICD-10-CM | POA: Diagnosis not present

## 2015-07-01 DIAGNOSIS — F988 Other specified behavioral and emotional disorders with onset usually occurring in childhood and adolescence: Secondary | ICD-10-CM

## 2015-07-01 MED ORDER — CIPROFLOXACIN-DEXAMETHASONE 0.3-0.1 % OT SUSP: 4.0000 [drp] | Freq: Two times a day (BID) | OTIC | Status: DC

## 2015-07-01 MED ORDER — AMPHETAMINE-DEXTROAMPHET ER 20 MG PO CP24: 20.0000 mg | ORAL_CAPSULE | Freq: Every day | ORAL | Status: DC

## 2015-07-01 MED ORDER — HYDROCODONE-ACETAMINOPHEN 5-325 MG PO TABS: 1.0000 | ORAL_TABLET | Freq: Four times a day (QID) | ORAL | Status: DC | PRN

## 2015-07-01 MED ORDER — ALPRAZOLAM 1 MG PO TABS: ORAL_TABLET | ORAL | Status: DC

## 2015-07-01 NOTE — Patient Instructions (Signed)
Follow up for ADD check ups

## 2015-07-01 NOTE — Progress Notes (Signed)
   Subjective:    Patient ID: Stephanie Mercado, female    DOB: 1990-12-03, 24 y.o.   MRN: [id number]  HPIleft ear pain. Jaw pain. Started 4 days ago. Went to urgent care in [place]. Prescribed antibiotic. Pt not sure of name. Didn't bring with with her. Ear bleeding today. Taking hydrocodone for pain. Vomiting today, headache today.   Patient relates [place] she needs a prescription for ADD  Review of Systems  Constitutional: Negative for fever and activity change.  HENT: Positive for ear pain. Negative for congestion and rhinorrhea.   Eyes: Negative for discharge.  Respiratory: Negative for cough, shortness of breath and wheezing.   Cardiovascular: Negative for chest pain.       Objective:   Physical Exam  Constitutional: She appears well-developed.  HENT:  Head: Normocephalic.  Nose: Nose normal.  Mouth/Throat: Oropharynx is clear and moist. No oropharyngeal exudate.  Left otitis externa  Neck: Neck supple.  Cardiovascular: Normal rate and normal heart sounds.   No murmur heard. Pulmonary/Chest: Effort normal and breath sounds normal. She has no wheezes.  Lymphadenopathy:    She has no cervical adenopathy.  Skin: Skin is warm and dry.  Nursing note and vitals reviewed.   Patient not toxic      Assessment & Plan:  Significant otitis externa-I recommend Cortisporin or Ciprodex(preferred) she was given a written prescription and Cortisporin encases the other wasn't covered  Complains of severe pain hydrocodone small number given not for frequent use or ongoing use caution drowsiness  History ADD needs refills on medicine recommend follow-up for ADD visit.

## 2015-07-30 ENCOUNTER — Telehealth: Payer: Self-pay | Admitting: Family Medicine

## 2015-07-30 MED ORDER — AMPHETAMINE-DEXTROAMPHET ER 20 MG PO CP24: 20.0000 mg | ORAL_CAPSULE | Freq: Every day | ORAL | Status: DC

## 2015-07-30 NOTE — Telephone Encounter (Signed)
Ok times one dr [place] last mo rec f u for adhd

## 2015-07-30 NOTE — Telephone Encounter (Signed)
Patient notified script will be ready for pickup. Office visit already scheduled.

## 2015-07-30 NOTE — Telephone Encounter (Signed)
amphetamine-dextroamphetamine (ADDERALL XR) 20 MG 24 hr capsule  Pt calling to get a refill please    Last add appt was 10/2014

## 2015-08-07 ENCOUNTER — Ambulatory Visit: Payer: Commercial Managed Care - HMO | Admitting: Family Medicine

## 2015-08-07 DIAGNOSIS — Z029 Encounter for administrative examinations, unspecified: Secondary | ICD-10-CM

## 2015-08-21 ENCOUNTER — Encounter: Payer: Commercial Managed Care - HMO | Admitting: Family Medicine

## 2015-08-26 ENCOUNTER — Encounter: Payer: Self-pay | Admitting: Family Medicine

## 2015-08-31 ENCOUNTER — Encounter: Payer: Self-pay | Admitting: Family Medicine

## 2015-08-31 ENCOUNTER — Ambulatory Visit (INDEPENDENT_AMBULATORY_CARE_PROVIDER_SITE_OTHER): Payer: Commercial Managed Care - HMO | Admitting: Family Medicine

## 2015-08-31 ENCOUNTER — Telehealth: Payer: Self-pay | Admitting: Family Medicine

## 2015-08-31 VITALS — Temp 98.2°F | Ht 68.0 in | Wt 133.5 lb

## 2015-08-31 DIAGNOSIS — F909 Attention-deficit hyperactivity disorder, unspecified type: Secondary | ICD-10-CM | POA: Diagnosis not present

## 2015-08-31 DIAGNOSIS — M79641 Pain in right hand: Secondary | ICD-10-CM

## 2015-08-31 DIAGNOSIS — H9201 Otalgia, right ear: Secondary | ICD-10-CM | POA: Diagnosis not present

## 2015-08-31 DIAGNOSIS — F988 Other specified behavioral and emotional disorders with onset usually occurring in childhood and adolescence: Secondary | ICD-10-CM

## 2015-08-31 MED ORDER — HYDROCODONE-ACETAMINOPHEN 7.5-325 MG PO TABS: 1.0000 | ORAL_TABLET | Freq: Four times a day (QID) | ORAL | Status: DC | PRN

## 2015-08-31 MED ORDER — HYDROCODONE-ACETAMINOPHEN 5-325 MG PO TABS: 1.0000 | ORAL_TABLET | Freq: Four times a day (QID) | ORAL | Status: DC | PRN

## 2015-08-31 MED ORDER — AMPHETAMINE-DEXTROAMPHET ER 20 MG PO CP24: 20.0000 mg | ORAL_CAPSULE | Freq: Every day | ORAL | Status: DC

## 2015-08-31 MED ORDER — DOXYCYCLINE HYCLATE 100 MG PO CAPS: 100.0000 mg | ORAL_CAPSULE | Freq: Two times a day (BID) | ORAL | Status: DC

## 2015-08-31 NOTE — Telephone Encounter (Signed)
Patient is requesting refill on adderall 20 mg last filled 9/15 has appointment 10/31 for med check.

## 2015-08-31 NOTE — Telephone Encounter (Signed)
Patient has appointment at 4:50 PM we will address it at that visit.

## 2015-08-31 NOTE — Progress Notes (Signed)
   Subjective:    Patient ID: Domenick BookbinderLeah J Hammonds, female    DOB: [birth year], 24 y.o.   MRN: [id number]  Otalgia  There is pain in the right ear. This is a new problem. The current episode started in the past 7 days. The problem occurs constantly. The problem has been unchanged. The maximum temperature recorded prior to her arrival was 101 - 101.9 F. The pain is moderate. Pertinent negatives include no abdominal pain or headaches. She has tried NSAIDs (hydrocodone) for the symptoms. The treatment provided moderate relief.   Patient also fell about a week and half ago injured her hand has pain and discomfort Patient also her ADD meds requests refill was told she does need to schedule follow-up    Review of Systems  Constitutional: Negative for activity change, appetite change and fatigue.  HENT: Positive for ear pain.   Gastrointestinal: Negative for abdominal pain.  Neurological: Negative for headaches.  Psychiatric/Behavioral: Negative for behavioral problems.       Objective:   Physical Exam  Patient has tenderness in the right hand. There is a raised area on the first medical carpal. Wrist is normal forearm normal she also has cellulitis in the outer ear. Neck no masses throat normal lungs clear      Assessment & Plan:  ADD one refill given she needs to follow-up  Patient under a lot of stress uses Xanax for insomnia  Right hand injury from falling about a week and half ago recommend x-ray  Cellulitis hydrocodone for severe pain caution drowsiness not for long-term use

## 2015-09-14 ENCOUNTER — Encounter: Payer: Commercial Managed Care - HMO | Admitting: Family Medicine

## 2015-09-14 DIAGNOSIS — Z029 Encounter for administrative examinations, unspecified: Secondary | ICD-10-CM

## 2015-09-15 ENCOUNTER — Encounter: Payer: Self-pay | Admitting: Family Medicine

## 2015-09-15 ENCOUNTER — Ambulatory Visit (INDEPENDENT_AMBULATORY_CARE_PROVIDER_SITE_OTHER): Payer: Commercial Managed Care - HMO | Admitting: Family Medicine

## 2015-09-15 VITALS — Ht 68.0 in | Wt 133.0 lb

## 2015-09-15 DIAGNOSIS — L02419 Cutaneous abscess of limb, unspecified: Secondary | ICD-10-CM

## 2015-09-15 DIAGNOSIS — L03119 Cellulitis of unspecified part of limb: Secondary | ICD-10-CM | POA: Diagnosis not present

## 2015-09-15 MED ORDER — MINOCYCLINE HCL 100 MG PO CAPS
100.0000 mg | ORAL_CAPSULE | Freq: Two times a day (BID) | ORAL | Status: DC
Start: 2015-09-15 — End: 2015-10-12

## 2015-09-15 MED ORDER — HYDROCODONE-ACETAMINOPHEN 5-325 MG PO TABS
1.0000 | ORAL_TABLET | Freq: Four times a day (QID) | ORAL | Status: DC | PRN
Start: 2015-09-15 — End: 2015-10-12

## 2015-09-15 NOTE — Progress Notes (Signed)
   Subjective:    Patient ID: Stephanie Mercado, female    DOB: [birth year], 24 y.o.   MRN: [id number]  HPI Patient arrives with a bum on the back on right leg since weekend. Patient took 2 days of doxy and it seemed to pop and did drain last pm. Patient relates severe tenderness redness pain discomfort draining a little states been present for the past treatments  Review of Systems Patient denies high fever chills sweats nausea vomiting diarrhea    Objective:   Physical Exam  On physical exam patient has an abscess and right region. Proximally 1/2 inches in diameter with some fluctuance and minimal drainage been recorded by the patient earlier appears some redness around tenderness rest of the leg appears normal      Assessment & Plan:  Procedure-after getting verbal consent area was injected with 1% lidocaine. #11 blade was used to drain this area. Small amount of packing was placed. Culture was sent. No complications. Doxycycline twice a day 10 days hydrocodone for severe pain caution drowsiness home use only not for long-term use, warm compresses frequently, follow-up in 24 hours for packing removal

## 2015-09-17 ENCOUNTER — Encounter: Payer: Self-pay | Admitting: Family Medicine

## 2015-09-17 ENCOUNTER — Other Ambulatory Visit: Payer: Self-pay | Admitting: Family Medicine

## 2015-09-17 LAB — WOUND CULTURE: ORGANISM ID, BACTERIA: NONE SEEN

## 2015-09-17 NOTE — Telephone Encounter (Signed)
May have a prescription for 20 tablets with previous instructions not for frequent use

## 2015-09-18 ENCOUNTER — Other Ambulatory Visit: Payer: Self-pay | Admitting: *Deleted

## 2015-09-18 ENCOUNTER — Telehealth: Payer: Self-pay | Admitting: Family Medicine

## 2015-09-18 MED ORDER — OXYCODONE HCL 5 MG PO TABS: 5.0000 mg | ORAL_TABLET | ORAL | Status: DC | PRN

## 2015-09-18 NOTE — Telephone Encounter (Signed)
May have script for 12 more tablets

## 2015-09-18 NOTE — Telephone Encounter (Signed)
Pt states her leg looks better, but the pain is still throbbing She had got down to 5 of the pills pain meds she wants to know If you will give her another refill for over the weekend just  In case she needs it

## 2015-09-18 NOTE — Telephone Encounter (Signed)
Pt wanted call back on [phone]. Tried to call and let her know scipt ready for pickup. vm full and cannot leave message. Script up front for pickup.

## 2015-09-18 NOTE — Telephone Encounter (Signed)
Pt called back and states she flushed the bottle of hydrocodone down the toliet bc it made her itch. She saw you yesterday and you wrote her a rx of oxycodone 5mg  #12 to take one every 4 hours and that is how she is taking sometimes waiting more than 4 hours. Down to 5 pills and wants a refill so she doesn't run out over the weekend.

## 2015-09-18 NOTE — Telephone Encounter (Signed)
Pt got #30 on 11/1 with directions one every 6 hours. [facility]

## 2015-09-18 NOTE — Telephone Encounter (Signed)
Pt.notified

## 2015-09-24 ENCOUNTER — Encounter: Payer: Commercial Managed Care - HMO | Admitting: Family Medicine

## 2015-09-24 DIAGNOSIS — Z029 Encounter for administrative examinations, unspecified: Secondary | ICD-10-CM

## 2015-09-28 ENCOUNTER — Other Ambulatory Visit: Payer: Self-pay | Admitting: Family Medicine

## 2015-09-28 NOTE — Telephone Encounter (Signed)
This and one refill 

## 2015-09-30 ENCOUNTER — Telehealth: Payer: Self-pay | Admitting: Family Medicine

## 2015-09-30 MED ORDER — AMPHETAMINE-DEXTROAMPHET ER 20 MG PO CP24: 20.0000 mg | ORAL_CAPSULE | Freq: Every day | ORAL | Status: DC

## 2015-09-30 NOTE — Telephone Encounter (Signed)
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 But currently she wants  To come in TODAY in stead around 4:30 or later.   Please advise

## 2015-09-30 NOTE — Telephone Encounter (Signed)
Discussed with Dr.Scott in real time and received verbal  orders for patient to get 2 weeks supply of Adderal and for patient to schedule ADD check up for two weeks out to receive additional refills. Called patient and informed her of two week supply and need for office visit in two weeks. Patient verbalized understanding.

## 2015-10-01 ENCOUNTER — Encounter: Payer: Commercial Managed Care - HMO | Admitting: Family Medicine

## 2015-10-12 ENCOUNTER — Ambulatory Visit (INDEPENDENT_AMBULATORY_CARE_PROVIDER_SITE_OTHER): Payer: Commercial Managed Care - HMO | Admitting: Family Medicine

## 2015-10-12 ENCOUNTER — Encounter: Payer: Self-pay | Admitting: Family Medicine

## 2015-10-12 VITALS — BP 118/78 | Ht 68.0 in | Wt 135.4 lb

## 2015-10-12 DIAGNOSIS — G4700 Insomnia, unspecified: Secondary | ICD-10-CM

## 2015-10-12 DIAGNOSIS — F988 Other specified behavioral and emotional disorders with onset usually occurring in childhood and adolescence: Secondary | ICD-10-CM

## 2015-10-12 DIAGNOSIS — F909 Attention-deficit hyperactivity disorder, unspecified type: Secondary | ICD-10-CM

## 2015-10-12 MED ORDER — ALPRAZOLAM 1 MG PO TABS: ORAL_TABLET | ORAL | Status: DC

## 2015-10-12 MED ORDER — AMPHETAMINE-DEXTROAMPHET ER 20 MG PO CP24: 20.0000 mg | ORAL_CAPSULE | Freq: Every day | ORAL | Status: DC

## 2015-10-12 NOTE — Patient Instructions (Signed)

## 2015-10-12 NOTE — Progress Notes (Signed)
   Subjective:    Patient ID: Domenick BookbinderLeah J Leckey, female    DOB: January 11, 1991, 24 y.o.   MRN: [id number]  HPI Patient arrives for a follow up on ADHD med. Patient is on Adderall xr 20mg  once a day. Patient has no problems.  patient overall doing well in school medication helps a lot she tries to maintain her best focus is possible  Review of Systems  Constitutional: Negative for activity change, appetite change and fatigue.  Gastrointestinal: Negative for abdominal pain.  Neurological: Negative for headaches.  Psychiatric/Behavioral: Negative for behavioral problems.       Objective:   Physical Exam  Constitutional: She appears well-developed and well-nourished.  HENT:  Head: Normocephalic.  Cardiovascular: Normal rate, regular rhythm and normal heart sounds.   No murmur heard. Pulmonary/Chest: Effort normal and breath sounds normal.  Neurological: She is alert.  Skin: Skin is warm and dry.  Psychiatric: She has a normal mood and affect.  Vitals reviewed.    patient states she gets headaches infrequently denies overuse of your set, uses Xanax at night intermittently to help her sleep uses her ADD medicine during the day and this helps greatly      Assessment & Plan:  The patient was seen today as part of the visit regarding ADD. Medications were reviewed with the patient as well as compliance. Side effects were checked for. Discussion regarding effectiveness was held. Prescriptions were written. Patient reminded to follow-up in approximately 3 months. Behavioral and study issues were addressed.

## 2015-10-14 ENCOUNTER — Other Ambulatory Visit: Payer: Self-pay | Admitting: Family Medicine

## 2015-10-21 NOTE — Telephone Encounter (Signed)
Reduce the prescription to 15 pills may have one refill

## 2015-10-23 NOTE — Telephone Encounter (Signed)
Reduce the prescription and 15 tablets as previously mentioned on the last message

## 2015-10-27 ENCOUNTER — Ambulatory Visit (INDEPENDENT_AMBULATORY_CARE_PROVIDER_SITE_OTHER): Payer: Commercial Managed Care - HMO | Admitting: Family Medicine

## 2015-10-27 ENCOUNTER — Encounter: Payer: Self-pay | Admitting: Family Medicine

## 2015-10-27 VITALS — BP 120/70 | Temp 98.2°F | Ht 68.0 in | Wt 148.4 lb

## 2015-10-27 DIAGNOSIS — J019 Acute sinusitis, unspecified: Secondary | ICD-10-CM

## 2015-10-27 DIAGNOSIS — B338 Other specified viral diseases: Secondary | ICD-10-CM | POA: Diagnosis not present

## 2015-10-27 DIAGNOSIS — B348 Other viral infections of unspecified site: Secondary | ICD-10-CM

## 2015-10-27 DIAGNOSIS — B9689 Other specified bacterial agents as the cause of diseases classified elsewhere: Secondary | ICD-10-CM

## 2015-10-27 MED ORDER — AZITHROMYCIN 250 MG PO TABS: ORAL_TABLET | ORAL | Status: DC

## 2015-10-27 MED ORDER — HYDROCODONE-HOMATROPINE 5-1.5 MG/5ML PO SYRP: 5.0000 mL | ORAL_SOLUTION | Freq: Four times a day (QID) | ORAL | Status: DC | PRN

## 2015-10-27 NOTE — Progress Notes (Signed)
   Subjective:    Patient ID: Stephanie BookbinderLeah J Mercado, female    DOB: 31-Jan-1991, 24 y.o.   MRN: [id number]  Sinusitis This is a new problem. The current episode started yesterday. There has been no fever. The pain is moderate. Associated symptoms include congestion, coughing and a sore throat. (Wheezing, runny nose) Past treatments include oral decongestants. The treatment provided no relief.   symptoms over the past several days with headache body aches sore throat not feeling good sinus pressure pain discomfort no nausea vomiting diarrhea  PMH benign   Review of Systems  HENT: Positive for congestion and sore throat.   Respiratory: Positive for cough.    relates body aches some headache low-grade fevers not feeling well     Objective:   Physical Exam  moderate sinus tenderness throat normal neck supple lungs clear heart regular pulse normal   patient not toxic     Assessment & Plan:   parainfluenza Viral syndrome Secondary rhinosinusitis Antibiotics scribe warning signs discussed  follow-up if progressive troubles

## 2015-10-28 ENCOUNTER — Telehealth: Payer: Self-pay | Admitting: Family Medicine

## 2015-10-28 NOTE — Telephone Encounter (Signed)
Patient called with c/o ongoing body aches and congestion and cough- no trouble breathing or SOB. Seen yesterday evening and sx started 2 days ago. Consult with Dr Lorin PicketScott. Advised patient that she has para influenza and sx can last 4-6 days-warning signs discussed. Patient verbalized understanding.

## 2015-10-28 NOTE — Telephone Encounter (Signed)
Pt called stating that she is not getting any better. Please advise. Pt was seen less than 24 hrs ago.

## 2015-10-29 ENCOUNTER — Telehealth: Payer: Self-pay | Admitting: Family Medicine

## 2015-10-29 NOTE — Telephone Encounter (Signed)
Patient was notified and verbalized understanding.

## 2015-10-29 NOTE — Telephone Encounter (Signed)
Pt is requesting a refill on her hycodan. Pt states that she is not out that she wants to make sure that she doesn't run out over the weekend.

## 2015-10-29 NOTE — Telephone Encounter (Signed)
No 18 doses rx'ed 48 hrs ago

## 2015-11-11 ENCOUNTER — Telehealth: Payer: Self-pay | Admitting: Family Medicine

## 2015-11-11 NOTE — Telephone Encounter (Signed)
Pt is going out of town and would like to know if she can get her adderall filled tomorrow instead of the 30.

## 2015-11-11 NOTE — Telephone Encounter (Signed)
Print a new prescription. If a phone call cannot take care of allowing it to be filled 1 day early.

## 2015-11-12 NOTE — Telephone Encounter (Signed)
Pharm states they will fill today. Pt notified on voicemail.

## 2015-11-27 ENCOUNTER — Ambulatory Visit (INDEPENDENT_AMBULATORY_CARE_PROVIDER_SITE_OTHER): Payer: Commercial Managed Care - HMO | Admitting: Family Medicine

## 2015-11-27 ENCOUNTER — Encounter: Payer: Self-pay | Admitting: Family Medicine

## 2015-11-27 VITALS — Temp 98.0°F | Ht 68.0 in | Wt 149.0 lb

## 2015-11-27 DIAGNOSIS — J019 Acute sinusitis, unspecified: Secondary | ICD-10-CM | POA: Diagnosis not present

## 2015-11-27 DIAGNOSIS — B9689 Other specified bacterial agents as the cause of diseases classified elsewhere: Secondary | ICD-10-CM

## 2015-11-27 MED ORDER — ALBUTEROL SULFATE HFA 108 (90 BASE) MCG/ACT IN AERS: 2.0000 | INHALATION_SPRAY | Freq: Four times a day (QID) | RESPIRATORY_TRACT | Status: DC | PRN

## 2015-11-27 MED ORDER — CEFDINIR 300 MG PO CAPS: ORAL_CAPSULE | ORAL | Status: DC

## 2015-11-27 MED ORDER — HYDROCODONE-HOMATROPINE 5-1.5 MG/5ML PO SYRP: ORAL_SOLUTION | ORAL | Status: DC

## 2015-11-27 NOTE — Progress Notes (Signed)
   Subjective:    Patient ID: Stephanie BookbinderLeah J Mercado, female    DOB: 1990/12/11, 25 y.o.   MRN: [id number]  Cough This is a new problem. The current episode started yesterday. Associated symptoms include nasal congestion. Treatments tried: hycodan-didnt finish z pack.   Patient notes major cough day and night. Productive at times.  Some wheeziness intermittently. No major fever. Recently had round of antibiotics see prior note. Review of Systems  Respiratory: Positive for cough.    no vomiting or diarrhea     Objective:   Physical Exam  Alert mild malaise. Intermittent cough during exam H&T moderate his congestion frontal tenderness pharynx normal neck supple lungs rare wheeze during cough no tachypnea heart regular in rhythm      Assessment & Plan:  Impression rhinosinusitis/bronchitis with element of reactive airways plan cough medicine refilled at patient request antibiotic prescribed. Symptom care discussed albuterol when necessary WSL

## 2016-01-18 ENCOUNTER — Telehealth: Payer: Self-pay | Admitting: Family Medicine

## 2016-01-18 MED ORDER — AMPHETAMINE-DEXTROAMPHET ER 20 MG PO CP24: 20.0000 mg | ORAL_CAPSULE | Freq: Every day | ORAL | Status: DC

## 2016-01-18 NOTE — Telephone Encounter (Signed)
Called patient and informed her per Dr.Scott Luking- 10 day supply of Adderall. Needs to keep appointment. Patient verbalized understanding.

## 2016-01-18 NOTE — Telephone Encounter (Signed)
10 day prescription please keep appointment

## 2016-01-18 NOTE — Telephone Encounter (Signed)
Patient needs refill on adderall xr 20 mg enough until appointment on 3/13.

## 2016-01-21 ENCOUNTER — Emergency Department (HOSPITAL_COMMUNITY)
Admission: EM | Admit: 2016-01-21 | Discharge: 2016-01-21 | Disposition: A | Discharge status: Home / Self Care | Payer: Commercial Managed Care - HMO | Attending: Emergency Medicine | Admitting: Emergency Medicine

## 2016-01-21 ENCOUNTER — Encounter (HOSPITAL_COMMUNITY): Payer: Self-pay | Admitting: Emergency Medicine

## 2016-01-21 ENCOUNTER — Ambulatory Visit: Payer: Commercial Managed Care - HMO | Admitting: Family Medicine

## 2016-01-21 DIAGNOSIS — S0181XA Laceration without foreign body of other part of head, initial encounter: Secondary | ICD-10-CM | POA: Diagnosis present

## 2016-01-21 DIAGNOSIS — Y9389 Activity, other specified: Secondary | ICD-10-CM | POA: Insufficient documentation

## 2016-01-21 DIAGNOSIS — Y999 Unspecified external cause status: Secondary | ICD-10-CM | POA: Insufficient documentation

## 2016-01-21 DIAGNOSIS — W228XXA Striking against or struck by other objects, initial encounter: Secondary | ICD-10-CM | POA: Diagnosis not present

## 2016-01-21 DIAGNOSIS — S01111A Laceration without foreign body of right eyelid and periocular area, initial encounter: Secondary | ICD-10-CM

## 2016-01-21 DIAGNOSIS — Y929 Unspecified place or not applicable: Secondary | ICD-10-CM | POA: Insufficient documentation

## 2016-01-21 DIAGNOSIS — Z029 Encounter for administrative examinations, unspecified: Secondary | ICD-10-CM

## 2016-01-21 NOTE — Discharge Instructions (Signed)

## 2016-01-21 NOTE — ED Notes (Signed)
Patient states she was feeding her dogs last night and they got under foot and she tripped hitting her right eyebrow on the door jam. States laceration and bleeding to right eyebrow. Used steri strips to close laceration, wants to know if she needs sutures.

## 2016-01-23 NOTE — ED Provider Notes (Signed)
CSN: [id number]     Arrival date & time 01/21/16  1344 History   First MD Initiated Contact with Patient 01/21/16 1507      Chief Complaint  Patient presents with  . Laceration     (Consider location/radiation/quality/duration/timing/severity/associated sxs/prior Treatment) The history is provided by the patient and a significant other.   Stephanie Mercado is a 25 y.o. female presenting with laceration to her right eyebrow which occurred yesteday evening.  She was feeding her dogs when she stumbled over them, hitting her brow against the door jamb.  She denies any loc, and denies headache, only complaint is for localized pain. Also denies focal weakness, dizziness, visual changes, eye pain or nausea.   Her boyfriend cleaned the wound then applied sterile strips.  Upon inspection today, she is concerned about possible need for sutures.  Her tetanus status is current.      Past Medical History  Diagnosis Date  . Migraines   . Allergy   . Eczema    Past Surgical History  Procedure Laterality Date  . Tonsillectomy     Family History  Problem Relation Age of Onset  . Cancer Mother   . Kidney disease Mother   . Diabetes Paternal Grandfather    Social History  Substance Use Topics  . Smoking status: Never Smoker   . Smokeless tobacco: None  . Alcohol Use: Yes   OB History    No data available     Review of Systems  Constitutional: Negative.   HENT:       Negative except as mentioned in HPI.    Eyes: Negative for pain and visual disturbance.  Respiratory: Negative.   Gastrointestinal: Negative for nausea and vomiting.  Skin: Positive for wound.  Neurological: Negative for dizziness, weakness, numbness and headaches.      Allergies  Penicillins  Home Medications   Prior to Admission medications   Medication Sig Start Date End Date Taking? Authorizing Provider  albuterol (PROVENTIL HFA;VENTOLIN HFA) 108 (90 Base) MCG/ACT inhaler Inhale 2 puffs into the lungs every 6  (six) hours as needed for wheezing or shortness of breath. 11/27/15   Merlyn Albert, MD  ALPRAZolam Prudy Feeler) 1 MG tablet TAKE 1 TABLET BY MOUTH AT BEDTIME AS NEEDED FOR SLEEP. 10/12/15   Babs Sciara, MD  amphetamine-dextroamphetamine (ADDERALL XR) 20 MG 24 hr capsule Take 1 capsule (20 mg total) by mouth daily. 01/18/16   Babs Sciara, MD  azithromycin (ZITHROMAX Z-PAK) 250 MG tablet Take 2 tablets (500 mg) on  Day 1,  followed by 1 tablet (250 mg) once daily on Days 2 through 5. 10/27/15   Babs Sciara, MD  butalbital-acetaminophen-caffeine (FIORICET, ESGIC) 50-325-40 MG tablet TAKE ONE TABLET BY MOUTH EVERY 4 HOURS AS NEEDED FOR HEADACHE. 10/23/15   Babs Sciara, MD  cefdinir (OMNICEF) 300 MG capsule Take 1 tablet twice a day for 10 days. 11/27/15   Merlyn Albert, MD  HYDROcodone-homatropine [facility]) 5-1.5 MG/5ML syrup Take 1 teaspoon every 6 hours as needed for cough 11/27/15   Merlyn Albert, MD  ibuprofen (ADVIL,MOTRIN) 200 MG tablet Take 800 mg by mouth 2 (two) times daily as needed (pain).    Historical Provider, MD  levonorgestrel (MIRENA) 20 MCG/24HR IUD 1 each by Intrauterine route once.    Historical Provider, MD   BP 116/81 mmHg  Pulse 102  Temp(Src) 98.2 F (36.8 C) (Oral)  Resp 16  Ht  (1.727 m)  Wt 58.968 kg  BMI 19.77 kg/m2  SpO2 94% Physical Exam  Constitutional: She is oriented to person, place, and time. She appears well-developed and well-nourished.  HENT:  Head: Normocephalic.  Right Ear: Tympanic membrane normal. No hemotympanum.  Left Ear: Tympanic membrane normal. No hemotympanum.  Eyes: Conjunctivae and EOM are normal. Pupils are equal, round, and reactive to light.  Neck: Normal range of motion. No spinous process tenderness present.  Cardiovascular: Normal rate.   Pulmonary/Chest: Effort normal.  Musculoskeletal: She exhibits tenderness.  Neurological: She is alert and oriented to person, place, and time. No sensory deficit.  Skin: Laceration  noted.  Sealed and well approximated 1.5 cm laceration lateral through right brow.  No significant surrounding edema.  Trace bruising settled in upper eyelid crease.      ED Course  Procedures (including critical care time) Labs Review Labs Reviewed - No data to display  Imaging Review No results found. I have personally reviewed and evaluated these images and lab results as part of my medical decision-making.   EKG Interpretation None      MDM   Final diagnoses:  Laceration of eyebrow, right, initial encounter    Reassurance given patient that the wound is well approximated and sutures would add no further benefit.  She was given sterile strips however, as RN here removed original ones, and I felt the wound would benefit from extra support for the next few days.  Pt tolerated well.  Advised prn f/u if sx worsen in any way including infection.  No signs of infection at this time.    Burgess AmorJulie Natsuko Kelsay, PA-C 01/23/16 0920  Samuel JesterKathleen McManus, DO 01/25/16 [phone]

## 2016-01-25 ENCOUNTER — Telehealth: Payer: Self-pay | Admitting: Family Medicine

## 2016-01-25 ENCOUNTER — Encounter: Payer: Commercial Managed Care - HMO | Admitting: Family Medicine

## 2016-01-25 ENCOUNTER — Encounter: Payer: Self-pay | Admitting: Family Medicine

## 2016-01-25 MED ORDER — AMPHETAMINE-DEXTROAMPHET ER 20 MG PO CP24: 20.0000 mg | ORAL_CAPSULE | Freq: Every day | ORAL | Status: DC

## 2016-01-25 NOTE — Telephone Encounter (Signed)
Script ready for pickup. Pt notified.  

## 2016-01-25 NOTE — Telephone Encounter (Signed)
She may have 30 day rx for ADD med

## 2016-01-25 NOTE — Telephone Encounter (Signed)
Discharge letter has been mailed to [facility] as certified mail.

## 2016-01-25 NOTE — Telephone Encounter (Signed)
Patient called to reschedule her ADHD appointment.  I explained to her that she would be discharged from our office with her 30 days of emergency care beginning today.  She understood.  Dr. Lorin PicketScott said that he would give her a refill on her medication if she called in.

## 2016-05-20 ENCOUNTER — Emergency Department (HOSPITAL_COMMUNITY): Payer: No Typology Code available for payment source

## 2016-05-20 ENCOUNTER — Emergency Department (HOSPITAL_COMMUNITY)
Admission: EM | Admit: 2016-05-20 | Discharge: 2016-05-20 | Disposition: A | Discharge status: Home / Self Care | Payer: No Typology Code available for payment source | Attending: Emergency Medicine | Admitting: Emergency Medicine

## 2016-05-20 ENCOUNTER — Encounter (HOSPITAL_COMMUNITY): Payer: Self-pay | Admitting: Emergency Medicine

## 2016-05-20 DIAGNOSIS — S93401A Sprain of unspecified ligament of right ankle, initial encounter: Secondary | ICD-10-CM | POA: Insufficient documentation

## 2016-05-20 DIAGNOSIS — Y999 Unspecified external cause status: Secondary | ICD-10-CM | POA: Diagnosis not present

## 2016-05-20 DIAGNOSIS — Y92410 Unspecified street and highway as the place of occurrence of the external cause: Secondary | ICD-10-CM | POA: Diagnosis not present

## 2016-05-20 DIAGNOSIS — M25512 Pain in left shoulder: Secondary | ICD-10-CM | POA: Diagnosis present

## 2016-05-20 DIAGNOSIS — S42022A Displaced fracture of shaft of left clavicle, initial encounter for closed fracture: Secondary | ICD-10-CM | POA: Diagnosis not present

## 2016-05-20 DIAGNOSIS — Y9389 Activity, other specified: Secondary | ICD-10-CM | POA: Diagnosis not present

## 2016-05-20 DIAGNOSIS — S42002A Fracture of unspecified part of left clavicle, initial encounter for closed fracture: Secondary | ICD-10-CM

## 2016-05-20 DIAGNOSIS — Z79899 Other long term (current) drug therapy: Secondary | ICD-10-CM | POA: Insufficient documentation

## 2016-05-20 MED ORDER — OXYCODONE-ACETAMINOPHEN 5-325 MG PO TABS: 1.0000 | ORAL_TABLET | ORAL | Status: DC | PRN

## 2016-05-20 MED ORDER — OXYCODONE-ACETAMINOPHEN 5-325 MG PO TABS
1.0000 | ORAL_TABLET | Freq: Once | ORAL | Status: AC
  Administered 2016-05-20: 1 via ORAL
  Filled 2016-05-20: qty 1
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
## 2016-06-22 ENCOUNTER — Encounter (HOSPITAL_COMMUNITY): Payer: Self-pay | Admitting: *Deleted

## 2016-06-22 ENCOUNTER — Emergency Department (HOSPITAL_COMMUNITY)
Admission: EM | Admit: 2016-06-22 | Discharge: 2016-06-22 | Disposition: A | Discharge status: Home / Self Care | Payer: Commercial Managed Care - HMO | Attending: Emergency Medicine | Admitting: Emergency Medicine

## 2016-06-22 DIAGNOSIS — Z23 Encounter for immunization: Secondary | ICD-10-CM | POA: Insufficient documentation

## 2016-06-22 DIAGNOSIS — Y929 Unspecified place or not applicable: Secondary | ICD-10-CM | POA: Insufficient documentation

## 2016-06-22 DIAGNOSIS — Y999 Unspecified external cause status: Secondary | ICD-10-CM | POA: Insufficient documentation

## 2016-06-22 DIAGNOSIS — S3023XA Contusion of vagina and vulva, initial encounter: Secondary | ICD-10-CM | POA: Insufficient documentation

## 2016-06-22 DIAGNOSIS — W458XXA Other foreign body or object entering through skin, initial encounter: Secondary | ICD-10-CM | POA: Insufficient documentation

## 2016-06-22 DIAGNOSIS — S91311A Laceration without foreign body, right foot, initial encounter: Secondary | ICD-10-CM

## 2016-06-22 DIAGNOSIS — S30854A Superficial foreign body of vagina and vulva, initial encounter: Secondary | ICD-10-CM | POA: Insufficient documentation

## 2016-06-22 DIAGNOSIS — Y9389 Activity, other specified: Secondary | ICD-10-CM | POA: Insufficient documentation

## 2016-06-22 MED ORDER — HYDROCODONE-ACETAMINOPHEN 5-325 MG PO TABS
1.0000 | ORAL_TABLET | Freq: Once | ORAL | Status: AC
  Administered 2016-06-22: 1 via ORAL
  Filled 2016-06-22: qty 1

## 2016-06-22 MED ORDER — HYDROCODONE-ACETAMINOPHEN 5-325 MG PO TABS: 1.0000 | ORAL_TABLET | ORAL | 0 refills | Status: DC | PRN

## 2016-06-22 MED ORDER — TETANUS-DIPHTH-ACELL PERTUSSIS 5-2.5-18.5 LF-MCG/0.5 IM SUSP
0.5000 mL | Freq: Once | INTRAMUSCULAR | Status: AC
  Administered 2016-06-22: 0.5 mL via INTRAMUSCULAR
  Filled 2016-06-22: qty 0.5

## 2016-06-22 MED ORDER — METRONIDAZOLE 500 MG PO TABS: 500.0000 mg | ORAL_TABLET | Freq: Two times a day (BID) | ORAL | 0 refills | Status: DC

## 2016-06-22 MED ORDER — IBUPROFEN 800 MG PO TABS
800.0000 mg | ORAL_TABLET | Freq: Once | ORAL | Status: AC
  Administered 2016-06-22: 800 mg via ORAL
  Filled 2016-06-22: qty 1

## 2016-06-22 MED ORDER — IBUPROFEN 600 MG PO TABS: 600.0000 mg | ORAL_TABLET | Freq: Four times a day (QID) | ORAL | 0 refills | Status: DC | PRN

## 2016-06-22 MED ORDER — CIPROFLOXACIN HCL 500 MG PO TABS: 500.0000 mg | ORAL_TABLET | Freq: Two times a day (BID) | ORAL | 0 refills | Status: DC

## 2016-06-22 NOTE — ED Notes (Signed)
Right foot soaked in betadine and water

## 2016-06-22 NOTE — ED Provider Notes (Signed)
[facility]-EM[facility]Y DEPT Provider Note   CSN: [id number] Arrival date & time: 06/22/16  1704  First Provider Contact:  First MD Initiated Contact with Patient 06/22/16 1720        History   Chief Complaint Chief Complaint  Patient presents with  . Groin Injury    HPI Stephanie Mercado is a 25 y.o. female.  Stephanie Mercado is a 25 y.o. Female with no significant past medical history presenting with 2 injuries, the first being a treble fishing hook puncture wound to her right labia and secondly a laceration to her right medial heel, both injuries occurring around 11 pm last night.  She [place] her [place].  She states she sat on a chair not realizing there was a fishing hook on the chair and one of the hooks became embedded in her right labia.  As she ran to the house she also lacerated her right heel on the metal edge of their storm door. She  Removed the hook herself, but woke today with increased pain, swelling and bruising of her labia.  She has applied ice to the site without improvement in pain and has not had any treatments for her heel laceration.  She reports she is not current with her tetanus vaccine.     The history is provided by the patient.    Past Medical History:  Diagnosis Date  . Allergy   . Eczema   . Migraines     Patient Active Problem List   Diagnosis Date Noted  . Insomnia 10/12/2015  . Skin lesion of back 07/25/2014  . ADD (attention deficit disorder) 03/31/2014  . Allergic rhinitis 03/31/2014  . Migraine headache with aura 02/15/2013    Past Surgical History:  Procedure Laterality Date  . TONSILLECTOMY      OB History    No data available       Home Medications    Prior to Admission medications   Medication Sig Start Date End Date Taking? Authorizing Provider  levonorgestrel (MIRENA) 20 MCG/24HR IUD 1 each by Intrauterine route once.   Yes Historical Provider, MD  ciprofloxacin (CIPRO) 500 MG tablet Take 1 tablet (500  mg total) by mouth 2 (two) times daily. 06/22/16   Burgess Amor, PA-C  HYDROcodone-acetaminophen (NORCO/VICODIN) 5-325 MG tablet Take 1 tablet by mouth every 4 (four) hours as needed. 06/22/16   Burgess Amor, PA-C  ibuprofen (ADVIL,MOTRIN) 600 MG tablet Take 1 tablet (600 mg total) by mouth every 6 (six) hours as needed. 06/22/16   Burgess Amor, PA-C  metroNIDAZOLE (FLAGYL) 500 MG tablet Take 1 tablet (500 mg total) by mouth 2 (two) times daily. 06/22/16   Burgess Amor, PA-C  oxyCODONE-acetaminophen (PERCOCET) 5-325 MG tablet Take 1 tablet by mouth every 4 (four) hours as needed for moderate pain. Patient not taking: Reported on 06/22/2016 05/20/16   Dione Booze, MD    Family History Family History  Problem Relation Age of Onset  . Cancer Mother   . Kidney disease Mother   . Diabetes Paternal Grandfather     Social History Social History  Substance Use Topics  . Smoking status: Never Smoker  . Smokeless tobacco: Never Used  . Alcohol use Yes     Allergies   Penicillins and Rocephin [ceftriaxone]   Review of Systems Review of Systems  Constitutional: Negative for fever.  HENT: Negative.   Eyes: Negative.   Respiratory: Negative for chest tightness and shortness of breath.  Cardiovascular: Negative for chest pain.  Gastrointestinal: Negative for abdominal pain, nausea and vomiting.  Genitourinary: Negative.  Negative for difficulty urinating.       Negative except as mentioned in HPI.   Musculoskeletal: Negative for arthralgias, joint swelling and neck pain.  Skin: Positive for wound.  Neurological: Negative.   Psychiatric/Behavioral: Negative.      Physical Exam Updated Vital Signs BP 134/96 (BP Location: Left Arm)   Pulse (!) 137   Temp 98.1 F (36.7 C) (Oral)   Resp 18   Ht 5\' 8"  (1.727 m)   Wt 63.5 kg   SpO2 98%   BMI 21.29 kg/m   Physical Exam  Constitutional: She appears well-developed and well-nourished.  HENT:  Head: Normocephalic and atraumatic.  Eyes: Conjunctivae  are normal.  Neck: Normal range of motion.  Cardiovascular: Normal rate, regular rhythm, normal heart sounds and intact distal pulses.   Pulmonary/Chest: Effort normal and breath sounds normal. She has no wheezes.  Abdominal: Soft. Bowel sounds are normal. There is no tenderness.  Genitourinary: There is tenderness and injury on the right labia.  Genitourinary Comments: Soft swelling and ecchymosis to right labia majora.  No trauma to labia minora.  There is no bleeding and no identifiable puncture wound or laceration.  No hematoma present.   Musculoskeletal: Normal range of motion.  Neurological: She is alert.  Skin: Skin is warm and dry. Laceration noted.  Dirty appearing 1 cm flap laceration right medial heel, hemostatic, subcutaneous.   Psychiatric: She has a normal mood and affect.  Nursing note and vitals reviewed.    ED Treatments / Results  Labs (all labs ordered are listed, but only abnormal results are displayed) Labs Reviewed - No data to display    Procedures Procedures (including critical care time)  LACERATION REPAIR Performed by: Burgess Amor Authorized by: Burgess Amor Consent: Verbal consent obtained. Risks and benefits: risks, benefits and alternatives were discussed Consent given by: patient Patient identity confirmed: provided demographic data Prepped and Draped in normal sterile fashion Wound explored  Laceration Location: right heel  Laceration Length: 2  Linear cm flap  No Foreign Bodies seen or palpated  Anesthesia: na  Local anesthetic: na Anesthetic total: na  Irrigation method: wound soaked in betadine and saline mix  Amount of cleaning: standard  Skin closure: sterile strips  Number of sutures: na Technique: sterile strips  Patient tolerance: Patient tolerated the procedure well with no immediate complications.   Medications Ordered in ED Medications  Tdap (BOOSTRIX) injection 0.5 mL (0.5 mLs Intramuscular Given 06/22/16 1801)    HYDROcodone-acetaminophen (NORCO/VICODIN) 5-325 MG per tablet 1 tablet (1 tablet Oral Given 06/22/16 1800)  ibuprofen (ADVIL,MOTRIN) tablet 800 mg (800 mg Oral Given 06/22/16 1800)     Initial Impression / Assessment and Plan / ED Course  I have reviewed the triage vital signs and the nursing notes.  Pertinent labs & imaging results that were available during my care of the patient were reviewed by me and considered in my medical decision making (see chart for details).  Clinical Course    Pt was placed on cipro anf flagyl given fresh water wound exposure.  She was also prescribed hydrocodone and ibuprofen.  Tetanus updated.  She was advised in sterile strip wound care, ice packs to perineum for the next 2 days for swelling reduction,  Warm water soaks while this site continues to heal.  Advised f/u with pcp for a recheck in one week if not improving, returning here for  any worsened sx.   Final Clinical Impressions(s) / ED Diagnoses   Final diagnoses:  Contusion of labia majora, initial encounter  Fishing hook foreign body, initial encounter  Laceration of heel, right, initial encounter    New Prescriptions New Prescriptions   CIPROFLOXACIN (CIPRO) 500 MG TABLET    Take 1 tablet (500 mg total) by mouth 2 (two) times daily.   HYDROCODONE-ACETAMINOPHEN (NORCO/VICODIN) 5-325 MG TABLET    Take 1 tablet by mouth every 4 (four) hours as needed.   IBUPROFEN (ADVIL,MOTRIN) 600 MG TABLET    Take 1 tablet (600 mg total) by mouth every 6 (six) hours as needed.   METRONIDAZOLE (FLAGYL) 500 MG TABLET    Take 1 tablet (500 mg total) by mouth 2 (two) times daily.     Burgess AmorJulie Charnay Nazario, PA-C 06/23/16 0144    Glynn OctaveStephen Rancour, MD 06/23/16 0200

## 2016-06-22 NOTE — ED Triage Notes (Signed)
Pt was fishing when she sat on a fishing hook, getting it caught on her right labia. Pt has bruising noted to this area. Pt also has a laceration to her right heel.

## 2017-01-27 ENCOUNTER — Emergency Department (HOSPITAL_COMMUNITY)
Admission: EM | Admit: 2017-01-27 | Discharge: 2017-01-27 | Disposition: A | Discharge status: Home / Self Care | Payer: Self-pay | Attending: Emergency Medicine | Admitting: Emergency Medicine

## 2017-01-27 ENCOUNTER — Emergency Department (HOSPITAL_COMMUNITY): Payer: Self-pay

## 2017-01-27 ENCOUNTER — Encounter (HOSPITAL_COMMUNITY): Payer: Self-pay | Admitting: *Deleted

## 2017-01-27 DIAGNOSIS — J111 Influenza due to unidentified influenza virus with other respiratory manifestations: Secondary | ICD-10-CM | POA: Insufficient documentation

## 2017-01-27 DIAGNOSIS — H579 Unspecified disorder of eye and adnexa: Secondary | ICD-10-CM | POA: Insufficient documentation

## 2017-01-27 DIAGNOSIS — Z79899 Other long term (current) drug therapy: Secondary | ICD-10-CM | POA: Insufficient documentation

## 2017-01-27 DIAGNOSIS — H66002 Acute suppurative otitis media without spontaneous rupture of ear drum, left ear: Secondary | ICD-10-CM | POA: Insufficient documentation

## 2017-01-27 MED ORDER — HYDROCOD POLST-CPM POLST ER 10-8 MG/5ML PO SUER: 5.0000 mL | Freq: Two times a day (BID) | ORAL | 0 refills | Status: DC | PRN

## 2017-01-27 MED ORDER — NEOMYCIN-POLYMYXIN-HC 1 % OT SOLN
3.0000 [drp] | Freq: Once | OTIC | Status: AC
  Administered 2017-01-27: 3 [drp] via OTIC
  Filled 2017-01-27: qty 10

## 2017-01-27 MED ORDER — ONDANSETRON 4 MG PO TBDP: 4.0000 mg | ORAL_TABLET | Freq: Three times a day (TID) | ORAL | 0 refills | Status: DC | PRN

## 2017-01-27 MED ORDER — ONDANSETRON HCL 4 MG/2ML IJ SOLN
4.0000 mg | Freq: Once | INTRAMUSCULAR | Status: AC
  Administered 2017-01-27: 4 mg via INTRAVENOUS
  Filled 2017-01-27: qty 2

## 2017-01-27 MED ORDER — SODIUM CHLORIDE 0.9 % IV BOLUS (SEPSIS)
1000.0000 mL | Freq: Once | INTRAVENOUS | Status: AC
  Administered 2017-01-27: 1000 mL via INTRAVENOUS

## 2017-01-27 MED ORDER — HYDROCOD POLST-CPM POLST ER 10-8 MG/5ML PO SUER
5.0000 mL | Freq: Once | ORAL | Status: AC
  Administered 2017-01-27: 5 mL via ORAL
  Filled 2017-01-27: qty 5

## 2017-01-27 NOTE — ED Triage Notes (Signed)
Pt comes in with cough and congestion starting 3 days ago. Pt sore throat and is hoarse now.

## 2017-01-27 NOTE — ED Notes (Signed)
Noticed pt shaky. Pt staets she did not notice. Pt denies feeling dizzy.

## 2017-01-27 NOTE — Discharge Instructions (Signed)
As described, it is important to get plenty of rest, drink plenty of fluids, and monitor your condition carefully. Return here for concerning changes.  Otherwise, please take the prescribed medication as directed, and he the provided eardrops, twice daily for the next week.

## 2017-01-27 NOTE — ED Notes (Signed)
When hr 160, pt stood from standing and walked to room behind triage, after sitting in bed and obtaining ekg, hr down to 130. edp aware

## 2017-01-27 NOTE — ED Provider Notes (Signed)
[facility] Provider Note   CSN: [id number] Arrival date & time: 01/27/17  1320     History   Chief Complaint Chief Complaint  Patient presents with  . Cough    HPI Stephanie Mercado is a 26 y.o. female.  HPI  Previously well young female presents with 5 days of generalized illness. She notes that until onset of illness she was healthy, denies history of chronic medical issues. She does have notable sick contact with a recently hospitalized individual. The patient was diagnosed with influenza. Patient did not receive her influenza vaccine this year. I'll, for the past 5 days patient has had progressive generalized discomfort, cough, nausea, posttussive emesis, with generalized weakness. There is also left ear discomfort, right eye conjunctival discharge. She is unclear if she has a fever. No relief in spite of using multiple OTC medications. Patient does not smoke, drinks socially.   Past Medical History:  Diagnosis Date  . Allergy   . Eczema   . Migraines     Patient Active Problem List   Diagnosis Date Noted  . Insomnia 10/12/2015  . Skin lesion of back 07/25/2014  . ADD (attention deficit disorder) 03/31/2014  . Allergic rhinitis 03/31/2014  . Migraine headache with aura 02/15/2013    Past Surgical History:  Procedure Laterality Date  . TONSILLECTOMY      OB History    No data available    IUD in place   Home Medications    Prior to Admission medications   Medication Sig Start Date End Date Taking? Authorizing Provider  guaiFENesin (ROBITUSSIN) 100 MG/5ML SOLN Take 15 mLs by mouth every 4 (four) hours as needed for cough or to loosen phlegm.   Yes Historical Provider, MD  ibuprofen (ADVIL,MOTRIN) 600 MG tablet Take 1 tablet (600 mg total) by mouth every 6 (six) hours as needed. 06/22/16  Yes Burgess Amor, PA-C  Levonorgestrel (KYLEENA) 19.5 MG IUD 1 each by Intrauterine route once.   Yes Historical Provider, MD  Pseudoephedrine-APAP-DM (DAYQUIL PO)  Take 2 capsules by mouth daily.   Yes Historical Provider, MD    Family History Family History  Problem Relation Age of Onset  . Cancer Mother   . Kidney disease Mother   . Diabetes Paternal Grandfather     Social History Social History  Substance Use Topics  . Smoking status: Never Smoker  . Smokeless tobacco: Never Used  . Alcohol use Yes     Comment: occ.     Allergies   Penicillins and Rocephin [ceftriaxone]   Review of Systems Review of Systems  Constitutional:       Per HPI, otherwise negative  HENT:       Per HPI, otherwise negative  Eyes: Positive for discharge and itching. Negative for photophobia and visual disturbance.  Respiratory:       Per HPI, otherwise negative  Cardiovascular:       Per HPI, otherwise negative  Gastrointestinal: Positive for vomiting. Negative for abdominal pain.  Endocrine:       Negative aside from HPI  Genitourinary:       Neg aside from HPI   Musculoskeletal:       Per HPI, otherwise negative  Skin: Negative.   Allergic/Immunologic: Negative for immunocompromised state.  Neurological: Positive for weakness. Negative for syncope.     Physical Exam Updated Vital Signs BP (!) 153/91 (BP Location: Right Arm)   Pulse (!) 127   Temp 98.7 F (37.1 C) (Oral)   Resp Marland Kitchen)  22   Ht 5\' 8"  (1.727 m)   Wt 155 lb (70.3 kg)   SpO2 98%   BMI 23.57 kg/m   Physical Exam  Constitutional: She is oriented to person, place, and time. She appears well-developed and well-nourished.  Uncomfortable appearing and female awake, alert, speaking clearly  HENT:  Head: Normocephalic and atraumatic.  Left Ear: There is tenderness. No mastoid tenderness. Tympanic membrane is injected. A middle ear effusion is present.  Mouth/Throat: Uvula is midline, oropharynx is clear and moist and mucous membranes are normal.  Eyes: Conjunctivae and EOM are normal.  No appreciable discharge, extraocular motion intact,  Cardiovascular:  Tachycardia, with  variable rate 140/160  Pulmonary/Chest: No stridor. Tachypnea noted. She has decreased breath sounds.  Abdominal: She exhibits no distension. There is no tenderness.  Musculoskeletal: She exhibits no edema.  Neurological: She is alert and oriented to person, place, and time. No cranial nerve deficit.  Skin: Skin is warm and dry.  Psychiatric: She has a normal mood and affect.  Nursing note and vitals reviewed.    ED Treatments / Results  Labs  EKG  EKG Interpretation  Date/Time:  Friday January 27 2017 15:28:15 EDT Ventricular Rate:  125 PR Interval:    QRS Duration: 88 QT Interval:  318 QTC Calculation: 459 R Axis:   67 Text Interpretation:  Sinus tachycardia Sinus tachycardia ST-t wave abnormality Abnormal ekg Confirmed by Gerhard MunchLOCKWOOD, Mercer Stallworth  MD [phone]([id number]) on 01/27/2017 4:47:15 PM       Radiology Dg Chest 2 View  Result Date: 01/27/2017 CLINICAL DATA:  Cough and fever with wheezing EXAM: CHEST  2 VIEW COMPARISON:  None. FINDINGS: Lungs are clear. Heart size and pulmonary vascularity are normal. No adenopathy. There is mid thoracic levoscoliosis. There is an old healed fracture of the left clavicle with remodeling. IMPRESSION: No edema or consolidation. Electronically Signed   By: Bretta BangWilliam  Woodruff III M.D.   On: 01/27/2017 14:19    Procedures Procedures (including critical care time)  Medications Ordered in ED Medications  NEOMYCIN-POLYMYXIN-HYDROCORTISONE (CORTISPORIN) otic solution 3 drop (not administered)  chlorpheniramine-HYDROcodone (TUSSIONEX) 10-8 MG/5ML suspension 5 mL (not administered)  sodium chloride 0.9 % bolus 1,000 mL (1,000 mLs Intravenous New Bag/Given 01/27/17 1601)  ondansetron (ZOFRAN) injection 4 mg (4 mg Intravenous Given 01/27/17 1602)  sodium chloride 0.9 % bolus 1,000 mL (1,000 mLs Intravenous New Bag/Given 01/27/17 1641)   Following initial IVF, HR now ~115  Initial Impression / Assessment and Plan / ED Course  I have reviewed the triage vital signs  and the nursing notes.  Pertinent labs & imaging results that were available during my care of the patient were reviewed by me and considered in my medical decision making (see chart for details).  6:44 PM Following 2 L fluid resuscitation patient had reasonable 5, cough less impressive, she states that she feels better. Remains otherwise hemodynamically stable. She is now complete by her mother. We discussed all findings, including reassuring x-ray, and my suspicion for influenza given her recent contact with sick individuals. With her improvement, she is appropriate for discharge with close outpatient follow-up.   Final Clinical Impressions(s) / ED Diagnoses   Final diagnoses:  Influenza  Acute suppurative otitis media of left ear without spontaneous rupture of tympanic membrane, recurrence not specified    New Prescriptions New Prescriptions   CHLORPHENIRAMINE-HYDROCODONE (TUSSIONEX PENNKINETIC ER) 10-8 MG/5ML SUER    Take 5 mLs by mouth every 12 (twelve) hours as needed for cough.   ONDANSETRON (ZOFRAN ODT)  4 MG DISINTEGRATING TABLET    Take 1 tablet (4 mg total) by mouth every 8 (eight) hours as needed for nausea or vomiting.     Gerhard Munch, MD 01/27/17 [phone]

## 2017-01-27 NOTE — ED Notes (Signed)
ED Provider at bedside. 

## 2017-01-27 NOTE — ED Notes (Signed)
Pt ambulatory to waiting room. Pt verbalized understanding of discharge instructions.   

## 2017-11-20 ENCOUNTER — Emergency Department (HOSPITAL_COMMUNITY): Admission: EM | Admit: 2017-11-20 | Discharge: 2017-11-20 | Discharge status: Against Medical Advice | Payer: Self-pay

## 2017-11-20 NOTE — ED Notes (Signed)
No answer when called 

## 2017-11-20 NOTE — ED Notes (Signed)
No answer

## 2017-12-01 ENCOUNTER — Emergency Department (HOSPITAL_COMMUNITY)
Admission: EM | Admit: 2017-12-01 | Discharge: 2017-12-01 | Disposition: A | Discharge status: Home / Self Care | Payer: Self-pay | Attending: Emergency Medicine | Admitting: Emergency Medicine

## 2017-12-01 ENCOUNTER — Other Ambulatory Visit: Payer: Self-pay

## 2017-12-01 ENCOUNTER — Encounter (HOSPITAL_COMMUNITY): Payer: Self-pay | Admitting: Emergency Medicine

## 2017-12-01 DIAGNOSIS — Z5321 Procedure and treatment not carried out due to patient leaving prior to being seen by health care provider: Secondary | ICD-10-CM | POA: Insufficient documentation

## 2017-12-01 DIAGNOSIS — R7989 Other specified abnormal findings of blood chemistry: Secondary | ICD-10-CM | POA: Insufficient documentation

## 2017-12-01 NOTE — ED Triage Notes (Signed)
Patient states she had labs drawn on Jan 2 and was called and told she may have some type of hepatitis.

## 2018-02-05 ENCOUNTER — Encounter (HOSPITAL_COMMUNITY): Payer: Self-pay | Admitting: Emergency Medicine

## 2018-02-05 ENCOUNTER — Other Ambulatory Visit: Payer: Self-pay

## 2018-02-05 ENCOUNTER — Inpatient Hospital Stay (HOSPITAL_COMMUNITY)
Admission: EM | Admit: 2018-02-05 | Discharge: 2018-02-07 | DRG: 917 | Disposition: A | Discharge status: Other Acute Inpatient Hospital | Payer: Self-pay | Attending: Internal Medicine | Admitting: Internal Medicine

## 2018-02-05 DIAGNOSIS — G4700 Insomnia, unspecified: Secondary | ICD-10-CM | POA: Diagnosis present

## 2018-02-05 DIAGNOSIS — Z833 Family history of diabetes mellitus: Secondary | ICD-10-CM

## 2018-02-05 DIAGNOSIS — R410 Disorientation, unspecified: Secondary | ICD-10-CM

## 2018-02-05 DIAGNOSIS — T50901A Poisoning by unspecified drugs, medicaments and biological substances, accidental (unintentional), initial encounter: Secondary | ICD-10-CM

## 2018-02-05 DIAGNOSIS — F10931 Alcohol use, unspecified with withdrawal delirium: Secondary | ICD-10-CM

## 2018-02-05 DIAGNOSIS — Z781 Physical restraint status: Secondary | ICD-10-CM

## 2018-02-05 DIAGNOSIS — D696 Thrombocytopenia, unspecified: Secondary | ICD-10-CM | POA: Diagnosis present

## 2018-02-05 DIAGNOSIS — T43592A Poisoning by other antipsychotics and neuroleptics, intentional self-harm, initial encounter: Principal | ICD-10-CM | POA: Diagnosis present

## 2018-02-05 DIAGNOSIS — G92 Toxic encephalopathy: Secondary | ICD-10-CM | POA: Diagnosis present

## 2018-02-05 DIAGNOSIS — F10231 Alcohol dependence with withdrawal delirium: Secondary | ICD-10-CM | POA: Diagnosis present

## 2018-02-05 HISTORY — DX: Disorientation, unspecified: R41.0

## 2018-02-05 HISTORY — DX: Poisoning by unspecified drugs, medicaments and biological substances, accidental (unintentional), initial encounter: T50.901A

## 2018-02-05 HISTORY — DX: Alcohol use, unspecified with withdrawal delirium: F10.931

## 2018-02-05 LAB — CBC WITH DIFFERENTIAL/PLATELET
BASOS ABS: 0 10*3/uL (ref 0.0–0.1)
BASOS PCT: 0 %
EOS PCT: 0 %
Eosinophils Absolute: 0 10*3/uL (ref 0.0–0.7)
HCT: 37.9 % (ref 36.0–46.0)
Hemoglobin: 12.5 g/dL (ref 12.0–15.0)
Lymphocytes Relative: 23 %
Lymphs Abs: 1.2 10*3/uL (ref 0.7–4.0)
MCH: 30.5 pg (ref 26.0–34.0)
MCHC: 33 g/dL (ref 30.0–36.0)
MCV: 92.4 fL (ref 78.0–100.0)
MONO ABS: 0.5 10*3/uL (ref 0.1–1.0)
Monocytes Relative: 10 %
Neutro Abs: 3.4 10*3/uL (ref 1.7–7.7)
Neutrophils Relative %: 67 %
Platelets: 93 10*3/uL — ABNORMAL LOW (ref 150–400)
RBC: 4.1 MIL/uL (ref 3.87–5.11)
RDW: 14.1 % (ref 11.5–15.5)
WBC: 5.1 10*3/uL (ref 4.0–10.5)

## 2018-02-05 LAB — MAGNESIUM: MAGNESIUM: 1.5 mg/dL — AB (ref 1.7–2.4)

## 2018-02-05 LAB — COMPREHENSIVE METABOLIC PANEL
ALBUMIN: 3.9 g/dL (ref 3.5–5.0)
ALT: 95 U/L — ABNORMAL HIGH (ref 14–54)
ANION GAP: 11 (ref 5–15)
AST: 43 U/L — AB (ref 15–41)
Alkaline Phosphatase: 124 U/L (ref 38–126)
BUN: 12 mg/dL (ref 6–20)
CHLORIDE: 100 mmol/L — AB (ref 101–111)
CO2: 25 mmol/L (ref 22–32)
Calcium: 9.2 mg/dL (ref 8.9–10.3)
Creatinine, Ser: 0.92 mg/dL (ref 0.44–1.00)
GFR calc Af Amer: >60 mL/min (ref >60)
GLUCOSE: 133 mg/dL — AB (ref 65–99)
Potassium: 3.7 mmol/L (ref 3.5–5.1)
Sodium: 136 mmol/L (ref 135–145)
Total Bilirubin: 1.4 mg/dL — ABNORMAL HIGH (ref 0.3–1.2)
Total Protein: 7.4 g/dL (ref 6.5–8.1)

## 2018-02-05 LAB — RAPID URINE DRUG SCREEN, HOSP PERFORMED
Amphetamines: NOT DETECTED
BENZODIAZEPINES: NOT DETECTED
Barbiturates: NOT DETECTED
Cocaine: NOT DETECTED
OPIATES: NOT DETECTED
TETRAHYDROCANNABINOL: NOT DETECTED

## 2018-02-05 LAB — ACETAMINOPHEN LEVEL: Acetaminophen (Tylenol), Serum: <10 ug/mL — ABNORMAL LOW (ref 10–30)

## 2018-02-05 LAB — I-STAT BETA HCG BLOOD, ED (MC, WL, AP ONLY)

## 2018-02-05 LAB — ETHANOL: Alcohol, Ethyl (B): <10 mg/dL (ref <10)

## 2018-02-05 MED ORDER — DEXMEDETOMIDINE HCL IN NACL 200 MCG/50ML IV SOLN
0.2000 ug/kg/h | INTRAVENOUS | Status: DC
  Administered 2018-02-05: 0.2 ug/kg/h via INTRAVENOUS
  Administered 2018-02-05: 0.6 ug/kg/h via INTRAVENOUS
  Filled 2018-02-05 (×3): qty 50

## 2018-02-05 MED ORDER — ONDANSETRON HCL 4 MG PO TABS: 4.0000 mg | ORAL_TABLET | Freq: Four times a day (QID) | ORAL | Status: DC | PRN

## 2018-02-05 MED ORDER — ACETAMINOPHEN 325 MG PO TABS: 650.0000 mg | ORAL_TABLET | Freq: Four times a day (QID) | ORAL | Status: DC | PRN

## 2018-02-05 MED ORDER — MIDAZOLAM HCL 2 MG/2ML IJ SOLN
1.0000 mg | INTRAMUSCULAR | Status: DC | PRN
  Administered 2018-02-05 – 2018-02-06 (×2): 2 mg via INTRAVENOUS
  Administered 2018-02-06: 1 mg via INTRAVENOUS
  Administered 2018-02-07: 2 mg via INTRAVENOUS
  Administered 2018-02-07: 1 mg via INTRAVENOUS
  Filled 2018-02-05 (×3): qty 2

## 2018-02-05 MED ORDER — VITAMIN B-1 100 MG PO TABS
100.0000 mg | ORAL_TABLET | Freq: Every day | ORAL | Status: DC
  Administered 2018-02-06 – 2018-02-07 (×2): 100 mg via ORAL
  Filled 2018-02-05 (×2): qty 1

## 2018-02-05 MED ORDER — METOPROLOL TARTRATE 5 MG/5ML IV SOLN
5.0000 mg | Freq: Once | INTRAVENOUS | Status: AC
  Administered 2018-02-05: 5 mg via INTRAVENOUS
  Filled 2018-02-05 (×2): qty 5

## 2018-02-05 MED ORDER — MAGNESIUM SULFATE 2 GM/50ML IV SOLN
2.0000 g | Freq: Once | INTRAVENOUS | Status: AC
  Administered 2018-02-05: 2 g via INTRAVENOUS
  Filled 2018-02-05: qty 50

## 2018-02-05 MED ORDER — LORAZEPAM 2 MG/ML IJ SOLN
1.0000 mg | Freq: Once | INTRAMUSCULAR | Status: AC
  Administered 2018-02-05: 1 mg via INTRAVENOUS
  Filled 2018-02-05: qty 1

## 2018-02-05 MED ORDER — POTASSIUM CHLORIDE IN NACL 20-0.9 MEQ/L-% IV SOLN
INTRAVENOUS | Status: DC
  Administered 2018-02-05 – 2018-02-07 (×4): via INTRAVENOUS

## 2018-02-05 MED ORDER — DEXMEDETOMIDINE HCL IN NACL 200 MCG/50ML IV SOLN
0.2000 ug/kg/h | INTRAVENOUS | Status: DC
  Administered 2018-02-05 (×2): 0.8 ug/kg/h via INTRAVENOUS
  Administered 2018-02-06: 0.5 ug/kg/h via INTRAVENOUS
  Administered 2018-02-06 (×2): 0.8 ug/kg/h via INTRAVENOUS
  Filled 2018-02-05: qty 50
  Filled 2018-02-05: qty 100
  Filled 2018-02-05: qty 50

## 2018-02-05 MED ORDER — SODIUM CHLORIDE 0.9 % IV BOLUS
1000.0000 mL | Freq: Once | INTRAVENOUS | Status: AC
  Administered 2018-02-05: 1000 mL via INTRAVENOUS

## 2018-02-05 MED ORDER — HALOPERIDOL LACTATE 5 MG/ML IJ SOLN
5.0000 mg | Freq: Once | INTRAMUSCULAR | Status: DC
  Filled 2018-02-05: qty 1

## 2018-02-05 MED ORDER — MIDAZOLAM HCL 2 MG/2ML IJ SOLN
INTRAMUSCULAR | Status: AC
  Filled 2018-02-05: qty 2

## 2018-02-05 MED ORDER — ADULT MULTIVITAMIN W/MINERALS CH
1.0000 | ORAL_TABLET | Freq: Every day | ORAL | Status: DC
  Administered 2018-02-06 – 2018-02-07 (×2): 1 via ORAL
  Filled 2018-02-05 (×2): qty 1

## 2018-02-05 MED ORDER — LACTATED RINGERS IV SOLN
INTRAVENOUS | Status: AC
  Administered 2018-02-05: 19:00:00 via INTRAVENOUS

## 2018-02-05 MED ORDER — HALOPERIDOL LACTATE 5 MG/ML IJ SOLN
10.0000 mg | Freq: Once | INTRAMUSCULAR | Status: AC
  Administered 2018-02-05: 10 mg via INTRAVENOUS
  Filled 2018-02-05: qty 2

## 2018-02-05 MED ORDER — ONDANSETRON HCL 4 MG/2ML IJ SOLN: 4.0000 mg | Freq: Four times a day (QID) | INTRAMUSCULAR | Status: DC | PRN

## 2018-02-05 MED ORDER — ACETAMINOPHEN 650 MG RE SUPP: 650.0000 mg | Freq: Four times a day (QID) | RECTAL | Status: DC | PRN

## 2018-02-05 MED ORDER — MAGNESIUM SULFATE 2 GM/50ML IV SOLN
2.0000 g | Freq: Once | INTRAVENOUS | Status: DC
  Administered 2018-02-05: 2 g via INTRAVENOUS
  Filled 2018-02-05: qty 50

## 2018-02-05 MED ORDER — FOLIC ACID 1 MG PO TABS
1.0000 mg | ORAL_TABLET | Freq: Every day | ORAL | Status: DC
  Administered 2018-02-06 – 2018-02-07 (×2): 1 mg via ORAL
  Filled 2018-02-05 (×2): qty 1

## 2018-02-05 MED ORDER — LORAZEPAM 2 MG/ML IJ SOLN
1.0000 mg | INTRAMUSCULAR | Status: DC | PRN
  Administered 2018-02-05: 2 mg via INTRAVENOUS
  Filled 2018-02-05: qty 1

## 2018-02-05 NOTE — ED Notes (Signed)
Notified by sitter that patient is hallucinating. Patient sitting in bed agitated, talking to people that aren't there and stating she sees people under the bed and not there. Dr Estell HarpinZammit made aware.

## 2018-02-05 NOTE — ED Provider Notes (Signed)
Stephanie Mercado Provider Note   CSN: [id number] Arrival date & time: 02/05/18  1255     History   Chief Complaint Chief Complaint  Patient presents with  . Psychiatric Evaluation    HPI Stephanie Mercado is a 27 y.o. female.  Patient states that she stopped drinking alcohol Friday because she was going to go to a rehab program.  She also states that she took 8 200 mg Seroquel between midnight and noon today.  Patient comes in here very agitated she is been hallucinating and her family states she is been throwing things and stating she is going to hurt herself  The history is provided by the patient. No language interpreter was used.  Illness  This is a new problem. The current episode started 6 to 12 hours ago. The problem occurs constantly. The problem has not changed since onset.Pertinent negatives include no chest pain, no abdominal pain and no headaches. Nothing aggravates the symptoms. Nothing relieves the symptoms. She has tried nothing for the symptoms. The treatment provided no relief.    Past Medical History:  Diagnosis Date  . Allergy   . Eczema   . Migraines     Patient Active Problem List   Diagnosis Date Noted  . Insomnia 10/12/2015  . Skin lesion of back 07/25/2014  . ADD (attention deficit disorder) 03/31/2014  . Allergic rhinitis 03/31/2014  . Migraine headache with aura 02/15/2013    Past Surgical History:  Procedure Laterality Date  . TONSILLECTOMY    . WISDOM TOOTH EXTRACTION       OB History   None      Home Medications    Prior to Admission medications   Medication Sig Start Date End Date Taking? Authorizing Provider  busPIRone (BUSPAR) 15 MG tablet Take 15 mg by mouth 3 (three) times daily as needed.  10/24/17  Yes [provider]  Levonorgestrel (KYLEENA) 19.5 MG IUD 1 each by Intrauterine route once.   Yes [provider]  albuterol (PROVENTIL HFA;VENTOLIN HFA) 108 (90 Base) MCG/ACT inhaler Inhale 2 puffs  into the lungs every 6 (six) hours as needed for wheezing or shortness of breath. 11/27/15 05/20/16  Merlyn AlbertLuking, William S, MD  amphetamine-dextroamphetamine (ADDERALL XR) 20 MG 24 hr capsule Take 1 capsule (20 mg total) by mouth daily. 01/25/16 05/20/16  Babs SciaraLuking, Scott A, MD    Family History Family History  Problem Relation Age of Onset  . Cancer Mother   . Kidney disease Mother   . Diabetes Paternal Grandfather     Social History Social History   Tobacco Use  . Smoking status: Never Smoker  . Smokeless tobacco: Never Used  Substance Use Topics  . Alcohol use: No    Frequency: Never    Comment: rehab  . Drug use: No     Allergies   Penicillins and Rocephin [ceftriaxone]   Review of Systems Review of Systems  Constitutional: Negative for appetite change and fatigue.  HENT: Negative for congestion, ear discharge and sinus pressure.   Eyes: Negative for discharge.  Respiratory: Negative for cough.   Cardiovascular: Negative for chest pain.  Gastrointestinal: Negative for abdominal pain and diarrhea.  Genitourinary: Negative for frequency and hematuria.  Musculoskeletal: Negative for back pain.  Skin: Negative for rash.  Neurological: Negative for seizures and headaches.  Psychiatric/Behavioral: Positive for agitation and hallucinations.     Physical Exam Updated Vital Signs BP 126/79   Pulse (!) 132   Temp 98.4 F (36.9 C) (  Oral)   Resp (!) 23   Ht 5\' 7"  (1.702 m)   Wt 68.9 kg (152 lb)   SpO2 98%   BMI 23.81 kg/m   Physical Exam  Constitutional: She is oriented to person, place, and time. She appears well-developed.  HENT:  Head: Normocephalic.  Eyes: Conjunctivae and EOM are normal. No scleral icterus.  Neck: Neck supple. No thyromegaly present.  Cardiovascular: Exam reveals no gallop and no friction rub.  No murmur heard. Tachycardia  Pulmonary/Chest: No stridor. She has no wheezes. She has no rales. She exhibits no tenderness.  Abdominal: She exhibits no  distension. There is no tenderness. There is no rebound.  Musculoskeletal: Normal range of motion. She exhibits no edema.  Lymphadenopathy:    She has no cervical adenopathy.  Neurological: She is alert and oriented to person, place, and time. She exhibits normal muscle tone. Coordination normal.  Patient hallucinating and very agitated  Skin: No rash noted. No erythema.  Psychiatric:  Patient states she is not suicidal but extremely agitated     ED Treatments / Results  Labs (all labs ordered are listed, but only abnormal results are displayed) Labs Reviewed  CBC WITH DIFFERENTIAL/PLATELET - Abnormal; Notable for the following components:      Result Value   Platelets 93 (*)    All other components within normal limits  COMPREHENSIVE METABOLIC PANEL - Abnormal; Notable for the following components:   Chloride 100 (*)    Glucose, Bld 133 (*)    AST 43 (*)    ALT 95 (*)    Total Bilirubin 1.4 (*)    All other components within normal limits  MAGNESIUM - Abnormal; Notable for the following components:   Magnesium 1.5 (*)    All other components within normal limits  RAPID URINE DRUG SCREEN, HOSP PERFORMED  ETHANOL  I-STAT BETA HCG BLOOD, ED ([facility], [facility], [facility] ONLY)    EKG None  Radiology No results found.  Procedures Procedures (including critical care time)  Medications Ordered in ED Medications  metoprolol tartrate (LOPRESSOR) injection 5 mg (has no administration in time range)  sodium chloride 0.9 % bolus 1,000 mL (1,000 mLs Intravenous Given 02/05/18 1423)  LORazepam (ATIVAN) injection 1 mg (1 mg Intravenous Given 02/05/18 1424)  LORazepam (ATIVAN) injection 1 mg (1 mg Intravenous Given 02/05/18 1550)   CRITICAL CARE Performed by: Bethann Berkshire Total critical care time: 40 minutes Critical care time was exclusive of separately billable procedures and treating other patients. Critical care was necessary to treat or prevent imminent or life-threatening  deterioration. Critical care was time spent personally by me on the following activities: development of treatment plan with patient and/or surrogate as well as nursing, discussions with consultants, evaluation of patient's response to treatment, examination of patient, obtaining history from patient or surrogate, ordering and performing treatments and interventions, ordering and review of laboratory studies, ordering and review of radiographic studies, pulse oximetry and re-evaluation of patient's condition.   Initial Impression / Assessment and Plan / ED Course  I have reviewed the triage vital signs and the nursing notes.  Pertinent labs & imaging results that were available during my care of the patient were reviewed by me and considered in my medical decision making (see chart for details).     Poison control consult with about the Seroquel overdose.  Patient should be observed for a number hours.  Patient continues to be some significantly agitated and I suspect she is in delirium tremors right now.  We will admit to medicine  Final Clinical Impressions(s) / ED Diagnoses   Final diagnoses:  DTs (delirium tremens) [facility])    ED Discharge Orders    None       Bethann Berkshire, MD 02/05/18 1609

## 2018-02-05 NOTE — ED Notes (Signed)
Patient still agitated and hallucinating. Words not making sense at this time. Constantly pulling at wires and taking off O2 monitor and B/P cuff. Able to redirect for a short time but will start talking to people that aren't there immediately after conversation and attempt to remove wires and cuff. Sitter remains at bedside.

## 2018-02-05 NOTE — H&P (Addendum)
History and Physical    Stephanie Mercado [name]:[id number] DOB: 01/22/1991 DOA: 02/05/2018  PCP: Patient, No Pcp Per / Pt goes to [facility]  Patient coming from: Home  Chief Complaint: Agitation and hallucinations  HPI: Stephanie Mercado is a 27 y.o. female with medical history significant for alcohol abuse, insomnia, and suicidal ideations who presented to the emergency department by her boyfriend after he witnessed the patient taking a "handful"of her Seroquel this morning that she is usually prescribed to have for insomnia.  Patient began to hallucinate and see family members in the room when they were not actually there and began to yell and started to become physically violent.  Boyfriend on the phone states that patient has had thoughts of suicide over the last 2 months and is due to start in alcohol abuse rehabilitation this week and has not been looking forward to this.  She apparently had a similar incident where she overdosed on Seroquel 4 months ago with similar symptoms at that time, but she was much less agitated and did not have any hallucinations.  Patient states that she last had alcohol approximately 4-5 days previously and has been trying her best to quit.  She currently denies any suicidal ideation or intent to harm herself.  I also spoke with patient's mother who is unsure of everything that is currently going on, but knows that her daughter needs help.  Patient is otherwise a poor historian currently and appears to be quite anxious and agitated at this time.   ED Course: Vital signs demonstrate significant tachycardia with heart rate in the 130 bpm range.  Blood pressure stable.  EKG demonstrates sinus tachycardia at 139 bpm.  Laboratory data is unremarkable aside from some thrombocytopenia with platelet count of 93,000 and mild hypomagnesemia with magnesium 1.5.  Her urine drug screen as well as alcohol screen is within normal limits.  Poison control has been contacted by ED physician with  recommendations to continue supportive care with IV fluid and monitor QT intervals.  Her current QTc is .  Review of Systems: Cannot be adequately obtained on account of patient condition.  Past Medical History:  Diagnosis Date  . Allergy   . Eczema   . Migraines     Past Surgical History:  Procedure Laterality Date  . TONSILLECTOMY    . WISDOM TOOTH EXTRACTION       reports that she has never smoked. She has never used smokeless tobacco. She reports that she does not drink alcohol or use drugs.  Allergies  Allergen Reactions  . Penicillins Hives    Has patient had a PCN reaction causing immediate rash, facial/tongue/throat swelling, SOB or lightheadedness with hypotension: Yes Has patient had a PCN reaction causing severe rash involving mucus membranes or skin necrosis: No Has patient had a PCN reaction that required hospitalization No Has patient had a PCN reaction occurring within the last 10 years: No If all of the above answers are "NO", then may proceed with Cephalosporin use.   . Rocephin [Ceftriaxone] Other (See Comments)    Muscle cramps in back (lasted about 8 hrs)    Family History  Problem Relation Age of Onset  . Cancer Mother   . Kidney disease Mother   . Diabetes Paternal Grandfather     Prior to Admission medications   Medication Sig Start Date End Date Taking? Authorizing Provider  busPIRone (BUSPAR) 15 MG tablet Take 15 mg by mouth 3 (three) times daily as needed.  10/24/17  Yes [provider]  Levonorgestrel (KYLEENA) 19.5 MG IUD 1 each by Intrauterine route once.   Yes [provider]  albuterol (PROVENTIL HFA;VENTOLIN HFA) 108 (90 Base) MCG/ACT inhaler Inhale 2 puffs into the lungs every 6 (six) hours as needed for wheezing or shortness of breath. 11/27/15 05/20/16  Merlyn AlbertLuking, William S, MD  amphetamine-dextroamphetamine (ADDERALL XR) 20 MG 24 hr capsule Take 1 capsule (20 mg total) by mouth daily. 01/25/16 05/20/16  Babs SciaraLuking, Scott A, MD      Physical Exam: Vitals:   02/05/18 1430 02/05/18 1455 02/05/18 1530 02/05/18 1600  BP: (!) 119/99 (!) 119/99 126/79 (!) 138/119  Pulse:  (!) 132    Resp: 19 18 (!) 23 (!) 24  Temp:      TempSrc:      SpO2:  98%    Weight:      Height:        Constitutional: NAD, severely anxious and mildly agitated with some pressured speech Vitals:   02/05/18 1430 02/05/18 1455 02/05/18 1530 02/05/18 1600  BP: (!) 119/99 (!) 119/99 126/79 (!) 138/119  Pulse:  (!) 132    Resp: 19 18 (!) 23 (!) 24  Temp:      TempSrc:      SpO2:  98%    Weight:      Height:       Eyes: lids and conjunctivae normal ENMT: Mucous membranes are moist.  Neck: normal, supple Respiratory: clear to auscultation bilaterally. Normal respiratory effort. No accessory muscle use.  Cardiovascular: Regular rate and rhythm, no murmurs.  Tachycardic.  No extremity edema. Abdomen: no tenderness, no distention. Bowel sounds positive.  Musculoskeletal:  No joint deformity upper and lower extremities.   Skin: no rashes, lesions, ulcers.    Labs on Admission: I have personally reviewed following labs and imaging studies  CBC: Recent Labs  Lab 02/05/18 1325  WBC 5.1  NEUTROABS 3.4  HGB 12.5  HCT 37.9  MCV 92.4  PLT 93*   Basic Metabolic Panel: Recent Labs  Lab 02/05/18 1325 02/05/18 1335  NA 136  --   K 3.7  --   CL 100*  --   CO2 25  --   GLUCOSE 133*  --   BUN 12  --   CREATININE 0.92  --   CALCIUM 9.2  --   MG  --  1.5*   GFR: Estimated Creatinine Clearance: 89.3 mL/min (by C-G formula based on SCr of 0.92 mg/dL). Liver Function Tests: Recent Labs  Lab 02/05/18 1325  AST 43*  ALT 95*  ALKPHOS 124  BILITOT 1.4*  PROT 7.4  ALBUMIN 3.9   No results for input(s): LIPASE, AMYLASE in the last 168 hours. No results for input(s): AMMONIA in the last 168 hours. Coagulation Profile: No results for input(s): INR, PROTIME in the last 168 hours. Cardiac Enzymes: No results for input(s): CKTOTAL,  CKMB, CKMBINDEX, TROPONINI in the last 168 hours. BNP (last 3 results) No results for input(s): PROBNP in the last 8760 hours. HbA1C: No results for input(s): HGBA1C in the last 72 hours. CBG: No results for input(s): GLUCAP in the last 168 hours. Lipid Profile: No results for input(s): CHOL, HDL, LDLCALC, TRIG, CHOLHDL, LDLDIRECT in the last 72 hours. Thyroid Function Tests: No results for input(s): TSH, T4TOTAL, FREET4, T3FREE, THYROIDAB in the last 72 hours. Anemia Panel: No results for input(s): VITAMINB12, FOLATE, FERRITIN, TIBC, IRON, RETICCTPCT in the last 72 hours. Urine analysis:    Component Value Date/Time  COLORURINE AMBER (A) 07/13/2014 2015   APPEARANCEUR HAZY (A) 07/13/2014 2015   LABSPEC >1.030 (H) 07/13/2014 2015   PHURINE 5.5 07/13/2014 2015   GLUCOSEU NEGATIVE 07/13/2014 2015   HGBUR NEGATIVE 07/13/2014 2015   BILIRUBINUR LARGE (A) 07/13/2014 2015   KETONESUR NEGATIVE 07/13/2014 2015   PROTEINUR NEGATIVE 07/13/2014 2015   UROBILINOGEN 1.0 07/13/2014 2015   NITRITE NEGATIVE 07/13/2014 2015   LEUKOCYTESUR NEGATIVE 07/13/2014 2015    Radiological Exams on Admission: No results found.  EKG: Independently reviewed.  Sinus tachycardia 139 bpm.  Assessment/Plan Principal Problem:   Alcohol withdrawal delirium, acute, hyperactive (HCC) Active Problems:   Insomnia   Overdose    1. Acute metabolic/toxic encephalopathy with severe agitation and hallucinations-multifactorial.  I believe much of this is attributed to her taking Seroquel this morning, but she also appears to have a factor of alcohol withdrawal with delirium tremens that is causing her to be quite agitated, tachycardic, and hypertensive.  She needs close monitoring on CIWA protocol in stepdown unit due to the severity of her condition.  Repeat EKG in a.m. to monitor QTC.  Telemetry monitoring ongoing. TTS evaluation will be ordered. 2. Mild hypomagnesemia.  Replete and recheck in a.m. 3. Insomnia.   Hold Seroquel on account of overdose.  We will not recommend any other agents at this time as patient should be receiving benzodiazepines for her acute delirium.   DVT prophylaxis: SCDs Code Status: Full Family Communication: Mother and BF on phone Disposition Plan: Observe for detox and TTS evaluation. Consults called: TTS Admission status: Observation, stepdown unit   Embree Brawley Hoover Brunette DO [facility] Pager [phone]  If 7PM-7AM, please contact night-coverage [email or web address] Password [facility]  02/05/2018, 4:16 PM

## 2018-02-05 NOTE — Progress Notes (Signed)
Poison control updated. Acetaminophen level ordered per their recommendation.

## 2018-02-05 NOTE — Progress Notes (Signed)
1830 Patient arrived to ICU room# 02 alert, agitated, very talkative, and stating phrases that do not make sense. Patient jumped out of bed ripped off heart monitor wires, nearing pulled her IV catheters out, and screamed "Where's Papa???" Patient continued to pull away from staff and ripped everything off. Security called and mitts applied to patient's hands.

## 2018-02-05 NOTE — ED Triage Notes (Addendum)
Family called ems d/t pt was in a rage throwing stuff and yelling,  Family also reported to ems that pt is suicidal, seeing and hearing things that are not there.  Pt denies si/hi.  Pt states that she took Seroquel 200mg  x 8 since 0300 this morning.  Pt denies trying to harm herself.  States she is going to rehab on wed of next week and she was trying to get one last high.  Pt reports she was hallucinating since she took the Seroquel.. Pt is calm and cooperative at this time.

## 2018-02-05 NOTE — Progress Notes (Signed)
Night shift stepdown coverage note  The patient was seen earlier due to restlessness.  Her chart was reviewed.  She was trying to get out of bed, get out of her restraints and was stating that she was inside the Internet despite receiving Ativan boluses and a being maxed on a Precedex infusion.  Haldol 10 mg IVP were ordered.  Magnesium was supplemented.  She is subsequently received Versed 2 mg IVP from another provider and has become more calm.  She is currently in bed with a sitter by her side for safety reasons.  Sanda Kleinavid Ortiz, MD

## 2018-02-05 NOTE — Progress Notes (Addendum)
At shift change patient very agitated, combative, hallucinating, trying to get out of bed and pulling off leads and IV.   RN gave patient 2mg  Ativan, increased Precedex gtt. No change in patient's behavior. RASS 3  Received order for restraints. Restraints applied. Patient ripped through the ankle restraints. They had to be reapplied.  Paged MD Robb Matarrtiz received order for another run of mag, change of IV fluids, and 10mg  Haldol. No change in [name] behavior.  Paged Elink Doc- received a new order for Precedex with increased parameters, 2mg  Versed, and 5 mg Haldol. Versed given, Precedex increased.  Held 5mg  Haldol due to Patient's RASS decreasing from 4 to -1.   Pt is now resting in 4 point restraints. VS stable Will continue to monitor and assess pt.  Genelle Balameron D Hashim Eichhorst, RN

## 2018-02-05 NOTE — BHH Counselor (Signed)
TTS informed patient is going to be medically admitted. TTS consult will be rescinded.

## 2018-02-05 NOTE — BHH Counselor (Signed)
TTS called to attempt to tele-psych patient. TTS was informed patient nurse was with another patient and would contact TTS back. Per note nursing note at 1641 patient is actively psychotic.

## 2018-02-05 NOTE — Progress Notes (Signed)
eLink Physician-Brief Progress Note Patient Name: Stephanie BookbinderLeah J Grunewald DOB: 04/03/1991 MRN: [id number]   Date of Service  02/05/2018  HPI/Events of Note  Delirium d/t ETOH withdrawal - Patient severely agitated. QTc interval = 0.43 seconds.   eICU Interventions  Will order: 1. Increase Precedex IV infusion to ceiling to 1.2 mcg/kg/hour. 2. Haldol 5 mg IV now.  3. D/C Ativan IV.  4. Versed 1-2 mg Q 1 hour PRN agitation.      Intervention Category Major Interventions: Delirium, psychosis, severe agitation - evaluation and management  Sommer,Steven Eugene 02/05/2018, 9:30 PM

## 2018-02-05 NOTE — ED Notes (Signed)
Pt ambulatory to bathroom with 2 person assist. Pt unsteady on feet and hallucinating during ambulation. Pt looked into toilet paper holder in bathroom and stated "You see my mom in there? She's looking right at us". Pt also made comments about all the "little people all over her room"

## 2018-02-06 ENCOUNTER — Other Ambulatory Visit: Payer: Self-pay

## 2018-02-06 DIAGNOSIS — T50902D Poisoning by unspecified drugs, medicaments and biological substances, intentional self-harm, subsequent encounter: Secondary | ICD-10-CM

## 2018-02-06 DIAGNOSIS — R410 Disorientation, unspecified: Secondary | ICD-10-CM

## 2018-02-06 LAB — BASIC METABOLIC PANEL WITH GFR
Anion gap: 9 (ref 5–15)
BUN: 10 mg/dL (ref 6–20)
CO2: 24 mmol/L (ref 22–32)
Calcium: 8.3 mg/dL — ABNORMAL LOW (ref 8.9–10.3)
Chloride: 105 mmol/L (ref 101–111)
Creatinine, Ser: 0.92 mg/dL (ref 0.44–1.00)
GFR calc Af Amer: >60 mL/min
GFR calc non Af Amer: >60 mL/min
Glucose, Bld: 139 mg/dL — ABNORMAL HIGH (ref 65–99)
Potassium: 3.6 mmol/L (ref 3.5–5.1)
Sodium: 138 mmol/L (ref 135–145)

## 2018-02-06 LAB — CBC
HCT: 36.8 % (ref 36.0–46.0)
Hemoglobin: 11.9 g/dL — ABNORMAL LOW (ref 12.0–15.0)
MCH: 30.4 pg (ref 26.0–34.0)
MCHC: 32.3 g/dL (ref 30.0–36.0)
MCV: 94.1 fL (ref 78.0–100.0)
Platelets: 89 K/uL — ABNORMAL LOW (ref 150–400)
RBC: 3.91 MIL/uL (ref 3.87–5.11)
RDW: 14.1 % (ref 11.5–15.5)
WBC: 5.5 K/uL (ref 4.0–10.5)

## 2018-02-06 LAB — GLUCOSE, CAPILLARY: Glucose-Capillary: 118 mg/dL — ABNORMAL HIGH (ref 65–99)

## 2018-02-06 LAB — MAGNESIUM: Magnesium: 2.5 mg/dL — ABNORMAL HIGH (ref 1.7–2.4)

## 2018-02-06 LAB — CK: Total CK: 141 U/L (ref 38–234)

## 2018-02-06 LAB — HIV ANTIBODY (ROUTINE TESTING W REFLEX): HIV Screen 4th Generation wRfx: NONREACTIVE

## 2018-02-06 LAB — MRSA PCR SCREENING: MRSA by PCR: NEGATIVE

## 2018-02-06 MED ORDER — TEMAZEPAM 15 MG PO CAPS: 30.0000 mg | ORAL_CAPSULE | Freq: Once | ORAL | Status: DC

## 2018-02-06 MED ORDER — TEMAZEPAM 15 MG PO CAPS
30.0000 mg | ORAL_CAPSULE | Freq: Once | ORAL | Status: AC
  Administered 2018-02-06: 30 mg via ORAL
  Filled 2018-02-06: qty 2

## 2018-02-06 MED ORDER — LACTATED RINGERS IV SOLN: INTRAVENOUS | Status: DC

## 2018-02-06 NOTE — Progress Notes (Addendum)
[name], Dionisio DavidRobert Wicks, called to be updated about pt. He states that pt has become increasingly violent in the last 3 months. He stated that pt told him that she had quit drinking a week ago, but he thinks it is more close to 3 days ago. Mr. Rico AlaWicks also states that he only witnessed pt taking the seroquel but knows she does have a problem with abusing her pills. Pt is on probation and is suppose to go to rehab in a few days.  Pt denied trying to hurt herself when this RN asked, but pt was also hallucinating, anxious, agitated, and combative.   Per [name] and father, they would like for Mr. Rico AlaWicks' number to be added to the chart as well as for him to receive updates  Genelle Balameron D Eliga Arvie, RN

## 2018-02-06 NOTE — Progress Notes (Signed)
Pt expressing [facility]. States she'll probably go to jail if she misses her check in date. Expressing she wants to go home to be able to meet with her probation officer and make her appointment date tomorrow. Explained to patient that she is not medically cleared to go home and per Dr. York Ramama's orders, will be involuntarily committed. Reassurance provided. Dr. Darnelle Catalanama paged

## 2018-02-06 NOTE — Progress Notes (Signed)
Restraints have been off since 0730 this morning. Pt not at risk to self or others. Following commands. Discontinuation criteria met. Order for restraints D/C. Pt currently ambulating in room and washing up without complications.

## 2018-02-06 NOTE — Progress Notes (Addendum)
Progress Note    Stephanie Mercado  [name]:[id number] DOB: 1991-07-24  DOA: 02/05/2018 PCP: Patient, No Pcp Per    Brief Narrative:   Chief complaint: Follow-up suicidal ideations, Seroquel overdose, alcohol withdrawal.  Medical records reviewed and are as summarized below:  Stephanie Mercado is an 27 y.o. female with a PMH of alcohol abuse, insomnia, and suicidal behaviors who was admitted 02/05/18 with an intentional overdose of Seroquel tablets witnessed by her boyfriend.  After taking the Seroquel, the patient began to hallucinate and become agitated.  Her last use of alcohol was 4-5 days ago.  Upon initial evaluation in the ED, the patient was anxious and agitated tachycardia.  Poison control was contacted as well.  Assessment/Plan:   Principal Problem:   Alcohol withdrawal delirium, acute, hyperactive (HCC) The patient was admitted to the stepdown unit and placed on an Ativan detox protocol.  She was noted to be agitated with hallucinations overnight.  She required restraint for safety.  She was ultimately placed on a Precedex infusion and also required Haldol and Versed.  Safety sitter is at the bedside.  Psychiatry consultation pending. Involuntarily committed secondary to wanting to leave the hospital AMA. The patient continues to be at high risk of clinical deterioration and requires a Precedex drip for detox.  Active Problems:   Insomnia Continue supportive care.    Overdose Overdosed on Seroquel.  Poison control contacted.  Supportive care. Monitor QT interval. EKG personally reviewed.QTC currently 455 ms.  Body mass index is 25.85 kg/m.   Family Communication/Anticipated D/C date and plan/Code Status   DVT prophylaxis: SCDs ordered. Code Status: Full Code.  Family Communication: No family present at the bedside. Disposition Plan: Possibly will need inpatient behavioral health hospitalization given suicidal ideation and ongoing alcohol abuse posing a risk to self.   Medical  Consultants:    Psychiatry   Anti-Infectives:    None  Subjective:   The patient tells me she wants to leave the hospital to make her court date. Denies pain, shortness of breath. Mild nausea.  Objective:    Vitals:   02/06/18 0630 02/06/18 0700 02/06/18 0729 02/06/18 0800  BP: 115/79 97/69  113/87  Pulse: 72 67 79 70  Resp: 18 19 (!) 28 (!) 21  Temp:   97.7 F (36.5 C)   TempSrc:   Oral   SpO2: 95% 95% 96% 95%  Weight:      Height:        Intake/Output Summary (Last 24 hours) at 02/06/2018 0833 Last data filed at 02/06/2018 0600 Gross per 24 hour  Intake 1418.05 ml  Output 700 ml  Net 718.05 ml   Filed Weights   02/05/18 1309 02/05/18 1830 02/06/18 0500  Weight: 68.9 kg (152 lb) 79.7 kg (175 lb 11.3 oz) 79.4 kg (175 lb 0.7 oz)    Exam: General: Sleepy but awakens to voice. Cardiovascular: Heart sounds show a regular rate, and rhythm. No gallops or rubs. No murmurs. No JVD. Lungs: Clear to auscultation bilaterally with good air movement. No rales, rhonchi or wheezes. Abdomen: Soft, nontender, nondistended with normal active bowel sounds. No masses. No hepatosplenomegaly. Neurological: Sleepy but oriented 3. Moves all extremities 4 with equal strength. Cranial nerves II through XII grossly intact. Tremulous. Skin: Warm and dry. No rashes or lesions. Extremities: No clubbing or cyanosis. No edema. Pedal pulses 2+. Psychiatric: Mood and affect are depressed/flat. Insight and judgment are poor.   Data Reviewed:   I have personally reviewed  following labs and imaging studies:  Labs: Labs show the following:   Basic Metabolic Panel: Recent Labs  Lab 02/05/18 1325 02/05/18 1335 02/06/18 0413  NA 136  --  138  K 3.7  --  3.6  CL 100*  --  105  CO2 25  --  24  GLUCOSE 133*  --  139*  BUN 12  --  10  CREATININE 0.92  --  0.92  CALCIUM 9.2  --  8.3*  MG  --  1.5* 2.5*   GFR Estimated Creatinine Clearance: 96 mL/min (by C-G formula based on SCr of  0.92 mg/dL). Liver Function Tests: Recent Labs  Lab 02/05/18 1325  AST 43*  ALT 95*  ALKPHOS 124  BILITOT 1.4*  PROT 7.4  ALBUMIN 3.9   CBC: Recent Labs  Lab 02/05/18 1325 02/06/18 0413  WBC 5.1 5.5  NEUTROABS 3.4  --   HGB 12.5 11.9*  HCT 37.9 36.8  MCV 92.4 94.1  PLT 93* 89*   Cardiac Enzymes: Recent Labs  Lab 02/06/18 0413  CKTOTAL 141   CBG: Recent Labs  Lab 02/06/18 0727  GLUCAP 118*   Microbiology Recent Results (from the past 240 hour(s))  MRSA PCR Screening     Status: None   Collection Time: 02/06/18 12:09 AM  Result Value Ref Range Status   MRSA by PCR NEGATIVE NEGATIVE Final    Comment:        The GeneXpert MRSA Assay (FDA approved for NASAL specimens only), is one component of a comprehensive MRSA colonization surveillance program. It is not intended to diagnose MRSA infection nor to guide or monitor treatment for MRSA infections. Performed at [facility], [address] [place], [place] [address]     Procedures and diagnostic studies:  No results found.  Medications:   . folic acid  1 mg Oral Daily  . haloperidol lactate  5 mg Intravenous Once  . multivitamin with minerals  1 tablet Oral Daily  . thiamine  100 mg Oral Daily   Continuous Infusions: . 0.9 % NaCl with KCl 20 mEq / L 100 mL/hr at 02/06/18 0600  . dexmedetomidine (PRECEDEX) IV infusion 0.6 mcg/kg/hr (02/06/18 0830)   Critical Care time: 30 minutes. Time in: 9:00 am Time out: 9:30 am  LOS: 0 days   Hillery Aldohristina Orlo Brickle  [facility] Pager [phone]. If unable to reach me by pager, please call my cell phone at [phone].  *Please refer to [email or web address], password [email or web address] to get updated schedule on who will round on this patient, as hospitalists switch teams weekly. If 7PM-7AM, please contact night-coverage at [email or web address], password [email or web address] for any overnight needs.  02/06/2018, 8:33 AM

## 2018-02-06 NOTE — Progress Notes (Signed)
Pt woke up and asked to use restroom as well as for something to drink. Pt is oriented and can answer all orientation questions correctly. Pt is still hallucinating and talking to someone in the room who is not there. Pt wanted restraints off and became agitatetd again. PRN versed dose given. Ankle restraints removed from patient but wrist restraints remain. RN educated patient on the reason for the restraints and the discontinuation criteria. Patient stated she understood.  Poison control called and was updated. Want a CK drawn with morning labs.  Genelle Balameron D Seraphine Gudiel, RN

## 2018-02-06 NOTE — Progress Notes (Signed)
Pt is easily aroused and is alert and oriented and can answer all orientation questions correctly. Pt requested that right wrist restraints be removed. Pt educated on discontinuation criteria. Pt verbalized understanding. Wrist restraints removed and pt offered ice water. Currently not a risk to self or others.

## 2018-02-06 NOTE — Clinical Social Work Note (Signed)
Pt is a 27 year old female admitted after intentional overdose on prescription medication. She is also having ETOH withdrawal. Discussed pt with MD in [place] today. Pt now in need of IVC. Assisted MD with completion of paperwork. Faxed to the magistrate. Originals of IVC paperwork to be placed on pt's chart. Will follow as needed.

## 2018-02-07 ENCOUNTER — Inpatient Hospital Stay (HOSPITAL_COMMUNITY)
Admission: AD | Admit: 2018-02-07 | Discharge: 2018-02-09 | DRG: 885 | Disposition: A | Discharge status: Home / Self Care | Payer: Federal, State, Local not specified - Other | Source: Other Acute Inpatient Hospital | Attending: Psychiatry | Admitting: Psychiatry

## 2018-02-07 DIAGNOSIS — Z880 Allergy status to penicillin: Secondary | ICD-10-CM

## 2018-02-07 DIAGNOSIS — Z813 Family history of other psychoactive substance abuse and dependence: Secondary | ICD-10-CM

## 2018-02-07 DIAGNOSIS — F322 Major depressive disorder, single episode, severe without psychotic features: Secondary | ICD-10-CM | POA: Diagnosis not present

## 2018-02-07 DIAGNOSIS — T43592A Poisoning by other antipsychotics and neuroleptics, intentional self-harm, initial encounter: Secondary | ICD-10-CM | POA: Diagnosis not present

## 2018-02-07 DIAGNOSIS — F1020 Alcohol dependence, uncomplicated: Secondary | ICD-10-CM

## 2018-02-07 DIAGNOSIS — Z881 Allergy status to other antibiotic agents status: Secondary | ICD-10-CM

## 2018-02-07 DIAGNOSIS — Z811 Family history of alcohol abuse and dependence: Secondary | ICD-10-CM | POA: Diagnosis not present

## 2018-02-07 DIAGNOSIS — T50902A Poisoning by unspecified drugs, medicaments and biological substances, intentional self-harm, initial encounter: Secondary | ICD-10-CM

## 2018-02-07 DIAGNOSIS — Z79899 Other long term (current) drug therapy: Secondary | ICD-10-CM | POA: Diagnosis not present

## 2018-02-07 DIAGNOSIS — Z818 Family history of other mental and behavioral disorders: Secondary | ICD-10-CM

## 2018-02-07 DIAGNOSIS — T1491XA Suicide attempt, initial encounter: Secondary | ICD-10-CM | POA: Diagnosis not present

## 2018-02-07 DIAGNOSIS — Z915 Personal history of self-harm: Secondary | ICD-10-CM

## 2018-02-07 DIAGNOSIS — D696 Thrombocytopenia, unspecified: Secondary | ICD-10-CM

## 2018-02-07 DIAGNOSIS — F419 Anxiety disorder, unspecified: Secondary | ICD-10-CM | POA: Diagnosis present

## 2018-02-07 DIAGNOSIS — G4700 Insomnia, unspecified: Secondary | ICD-10-CM | POA: Diagnosis present

## 2018-02-07 DIAGNOSIS — F329 Major depressive disorder, single episode, unspecified: Secondary | ICD-10-CM | POA: Diagnosis present

## 2018-02-07 LAB — GLUCOSE, CAPILLARY: Glucose-Capillary: 97 mg/dL (ref 65–99)

## 2018-02-07 LAB — TSH: TSH: 1.663 u[IU]/mL (ref 0.350–4.500)

## 2018-02-07 MED ORDER — ADULT MULTIVITAMIN W/MINERALS CH: 1.0000 | ORAL_TABLET | Freq: Every day | ORAL | Status: DC

## 2018-02-07 MED ORDER — THIAMINE HCL 100 MG PO TABS: 100.0000 mg | ORAL_TABLET | Freq: Every day | ORAL | Status: DC

## 2018-02-07 MED ORDER — FOLIC ACID 1 MG PO TABS: 1.0000 mg | ORAL_TABLET | Freq: Every day | ORAL | Status: DC

## 2018-02-07 MED ORDER — THIAMINE HCL 100 MG/ML IJ SOLN: 100.0000 mg | Freq: Every day | INTRAMUSCULAR | Status: DC

## 2018-02-07 MED ORDER — CHLORDIAZEPOXIDE HCL 25 MG PO CAPS: 25.0000 mg | ORAL_CAPSULE | Freq: Three times a day (TID) | ORAL | 0 refills | Status: DC

## 2018-02-07 MED ORDER — LORAZEPAM 2 MG/ML IJ SOLN: 1.0000 mg | Freq: Four times a day (QID) | INTRAMUSCULAR | Status: DC | PRN

## 2018-02-07 MED ORDER — SODIUM CHLORIDE 0.9 % IV BOLUS
500.0000 mL | Freq: Once | INTRAVENOUS | Status: AC
  Administered 2018-02-07: 500 mL via INTRAVENOUS

## 2018-02-07 MED ORDER — CHLORDIAZEPOXIDE HCL 25 MG PO CAPS
25.0000 mg | ORAL_CAPSULE | Freq: Three times a day (TID) | ORAL | Status: DC
  Administered 2018-02-07 (×2): 25 mg via ORAL
  Filled 2018-02-07 (×2): qty 1

## 2018-02-07 MED ORDER — VITAMIN B-1 100 MG PO TABS: 100.0000 mg | ORAL_TABLET | Freq: Every day | ORAL | Status: DC

## 2018-02-07 MED ORDER — LORAZEPAM 1 MG PO TABS
1.0000 mg | ORAL_TABLET | Freq: Four times a day (QID) | ORAL | Status: DC | PRN
  Administered 2018-02-07: 1 mg via ORAL
  Filled 2018-02-07: qty 1

## 2018-02-07 NOTE — Progress Notes (Addendum)
[facility] PROGRESS NOTE  Domenick BookbinderLeah J Woodrome WNU:[id number] DOB: 11/17/1990 DOA: 02/05/2018 PCP: Patient, No Pcp Per  Brief summary   27 y.o. female with a PMH of alcohol abuse, insomnia, and suicidal behaviors who was admitted 02/05/18 with an intentional overdose of Seroquel tablets witnessed by her boyfriend.  After taking the Seroquel, the patient began to hallucinate and become agitated.  Her last use of alcohol was 4-5 days ago.  Upon initial evaluation in the ED, the patient was anxious and agitated tachycardia.  Poison control was contacted as well.    Assessment/Plan:  Alcohol withdrawal delirium, acute, hyperactive (HCC). The patient was admitted to the stepdown unit and placed on an Ativan detox protocol.  She was noted to be agitated with hallucinations overnight.  She required restraint for safety.  She was ultimately placed on a Precedex infusion and also required Haldol and Versed.  Safety sitter is at the bedside. Weaned from precedex 3/27. will start short course of librium. Monitor on ciwa. Psychiatry consultation pending. Involuntarily committed secondary to wanting to leave the hospital AMA. The patient continues to be at high risk of clinical deterioration. Check tsh.  Insomnia. Continue supportive care.  Overdose. Overdosed on Seroquel.  Poison control contacted.  Supportive care. Monitor QT interval. EKG personally reviewed.QTC currently 455 ms.  Suicidal attempt. Patient is under IVC. Cont safety monitor. Suicidal precautions. Consulted psychiatry eval. Defer further management to psychiatry   Body mass index is 25.85 kg/m.  Thrombocytopenia. Likely due to alcohol use. No s/s of bleeding. Will recheck AM  Addendum: patient is medically stable for inpatient psychiatry admission or transfer    Code Status: full Family Communication: d/w patient, RN (indicate person spoken with, relationship, and if by phone, the number) Disposition Plan: pend clinical  improvement. Psych eval. Under IVC   Consultants:  Psychiatry   Procedures:  none  Antibiotics:  none (indicate start date, and stop date if known)  HPI/Subjective: Alert. Reports feeling better, mild tachycardia, no tremulousness. Off precedex drip.   Objective: Vitals:   02/07/18 0659 02/07/18 0700  BP:  138/82  Pulse:  95  Resp:  (!) 41  Temp: 98.2 F (36.8 C)   SpO2:  98%    Intake/Output Summary (Last 24 hours) at 02/07/2018 0853 Last data filed at 02/06/2018 1800 Gross per 24 hour  Intake 1085.62 ml  Output -  Net 1085.62 ml   Filed Weights   02/05/18 1830 02/06/18 0500 02/07/18 0500  Weight: 79.7 kg (175 lb 11.3 oz) 79.4 kg (175 lb 0.7 oz) 81.2 kg (179 lb 0.2 oz)    Exam:   General:  No distress   Cardiovascular: s1,s2 rrr  Respiratory: CTA BL  Abdomen: soft, nt, nd   Musculoskeletal: no leg edema    Data Reviewed: Basic Metabolic Panel: Recent Labs  Lab 02/05/18 1325 02/05/18 1335 02/06/18 0413  NA 136  --  138  K 3.7  --  3.6  CL 100*  --  105  CO2 25  --  24  GLUCOSE 133*  --  139*  BUN 12  --  10  CREATININE 0.92  --  0.92  CALCIUM 9.2  --  8.3*  MG  --  1.5* 2.5*   Liver Function Tests: Recent Labs  Lab 02/05/18 1325  AST 43*  ALT 95*  ALKPHOS 124  BILITOT 1.4*  PROT 7.4  ALBUMIN 3.9   No results for input(s): LIPASE, AMYLASE in the last 168 hours. No results for input(s):  AMMONIA in the last 168 hours. CBC: Recent Labs  Lab 02/05/18 1325 02/06/18 0413  WBC 5.1 5.5  NEUTROABS 3.4  --   HGB 12.5 11.9*  HCT 37.9 36.8  MCV 92.4 94.1  PLT 93* 89*   Cardiac Enzymes: Recent Labs  Lab 02/06/18 0413  CKTOTAL 141   BNP (last 3 results) No results for input(s): BNP in the last 8760 hours.  ProBNP (last 3 results) No results for input(s): PROBNP in the last 8760 hours.  CBG: Recent Labs  Lab 02/06/18 0727 02/07/18 0750  GLUCAP 118* 97    Recent Results (from the past 240 hour(s))  MRSA PCR Screening      Status: None   Collection Time: 02/06/18 12:09 AM  Result Value Ref Range Status   MRSA by PCR NEGATIVE NEGATIVE Final    Comment:        The GeneXpert MRSA Assay (FDA approved for NASAL specimens only), is one component of a comprehensive MRSA colonization surveillance program. It is not intended to diagnose MRSA infection nor to guide or monitor treatment for MRSA infections. Performed at [facility], [address] [place], [place] [address]      Studies: No results found.  Scheduled Meds: . folic acid  1 mg Oral Daily  . haloperidol lactate  5 mg Intravenous Once  . multivitamin with minerals  1 tablet Oral Daily  . thiamine  100 mg Oral Daily   Continuous Infusions: . 0.9 % NaCl with KCl 20 mEq / L 100 mL/hr at 02/07/18 0829  . dexmedetomidine (PRECEDEX) IV infusion Stopped (02/06/18 1600)    Principal Problem:   Alcohol withdrawal delirium, acute, hyperactive (HCC) Active Problems:   Insomnia   Overdose   Delirium    Time spent: >35 minutes     Esperanza Sheets  [facility] Pager [phone]. If 7PM-7AM, please contact night-coverage at [email or web address], password [facility] 02/07/2018, 8:53 AM  LOS: 1 day

## 2018-02-07 NOTE — Progress Notes (Signed)
Sheriff deputy arrived to pick up patient about 2030 and was given all proper paperwork. Pt packed some belongings into patient belonging bag. Removed IV. Pt left with deputy at 2100. [facility].

## 2018-02-07 NOTE — Progress Notes (Signed)
[facility]. Rankin, NP is the accepting provider. Dr. Tamera Puntleary is the attending provider.   Call report to [phone]   RN @ [facility] ICU notified.    Pt is IVC.   Pt may be transported by [employer]. Pt is scheduled to be picked up at 9:30pm and [facility]15[facility].   Stephanie GuilesSarah Jaquail Mclees, LCSW, LCAS Disposition CSW  [facility] [phone]

## 2018-02-07 NOTE — Progress Notes (Addendum)
This RN has spoke to patients mother and the mother has expressed concerns about her daughter and how she really wants her to get some behavioral health help. Her mother states that the patient has had a change in personality since 2016 and she just "wants her Tais back". She explained that Tacey RuizLeah has started to lie about almost everything and that it seems very convincing to the point that Tacey RuizLeah believes her own lies. Kasheena's boyfriend, Molly MaduroRobert also spoke to this RN about his concerns and expressed how Shericka has been suicidal over the past month because she feels like her life is going to be over due to Rehab. He agreed with her mother about how she has been lying a lot. He states she has not been able to stay clean for longer than a week at a time in the last year and that she heavily abuses alcohol and pills. The boyfriend and mother has also spoke to behavioral health about the same concerns per pts mother and boyfriend.   Cristie Hemiffani K Gregory Barrick, RN 02/07/18

## 2018-02-07 NOTE — BH Assessment (Signed)
Tele Assessment Note   Patient Name: Stephanie Mercado MRN: [id number] Referring Physician: York Spaniel Location of Patient: [facility] Location of Provider: Behavioral Health TTS Department  ITALIA WOLFERT is an 27 y.o. female who presents involuntarilyl accompanied by mom Stephanie Mercado, and BF Stephanie Mercado (phone-[phone]) reporting primary symptoms of pt overdosing on 8 Seroquel pills. Pt was admitted to the medical floor and medically cleared. Pt denies current symptoms of SI/ HI/ AVH and states that she just wants to go home to get ready for treatment which she is beginning at [facility] on April 3rd (She was supposed to start treatment today, but her hospitalization has delayed it). Due to combativeness, pt was IVC'd at the hospital.  Collateral information: Mom and BF states that pt should not come home--they are concerned about pt because she has been making statements over the past 2 months since her dad bailed her out of jail, "I wish I would die, I wish my mom had killed me", and making statements about how she wants to run from the law and use her passport to leave the country. [name] mom states that pt has lied about having a job, she has taken out 15 credit cards in her parent's names and her grandmother's name and put hem into bankruptcy. Mom states that pt, "acts like she hates [place] one minute, like she could kill [place] and then nice another moment". BF states, "She is always depressed".  Pt acknowledges symptoms including severe anxiety feelings of hopelessness.   Pt states that onset of symptoms began when she started drinking 2 years ago due to multiple stressors coming at one time--her mom got cancer, she had a bad break-up, her career wasn't working out.  Pt identifies primary stressors as anxiety, SA. Pt identifies primary residence as with parents, BF. Pt identifies primary supports as Parents, BF. [name] social history includes started drinking at age 34, drank heavily for past 2 years. Pt states  work history includes no job until she finishes treatment. Pt identifies legal involvement as 2 DUIs. Pt denies abuse history.  Pt identifies current/previous treatment as :OP at [facility] (just started), IP at [facility], [facility], relapsed 3x since December 2018. Pt reports medication compliance with Seroquel. Pt describes family MH/SA history as mom with anxiety, mom and dad in recovery; mom with ETOH, dad opiates.  Pt has poor insight and judgment. [name] memory is typical..?  ? MSE: Pt is casually dressed, alert, oriented x4 with normal speech and normal motor behavior. Eye contact is good. [name] mood is anxious and affect is  congruent with mood. Thought process is coherent and relevant. There is no indication that pt is currently responding to internal stimuli or experiencing delusional thought content. Pt was cooperative throughout assessment.   Shuvon, NP, recommended inpatient psychiatric treatment. Per Miki Kins, pt is accepted to [facility]. Notified [facility] staff, who will fax IVC paperwork. Pt will be transported by [employer].  Diagnosis:Primary Mental Health   F31.4 Bipolar depressed severe, without psychosis  F10.20 Alcohol use disorder Severe   Past Medical History:  Past Medical History:  Diagnosis Date  . Allergy   . Eczema   . Migraines     Past Surgical History:  Procedure Laterality Date  . TONSILLECTOMY    . WISDOM TOOTH EXTRACTION      Family History:  Family History  Problem Relation Age of Onset  . Cancer Mother   . Kidney disease Mother   . Diabetes Paternal Grandfather  Social History:  reports that she has never smoked. She has never used smokeless tobacco. She reports that she does not drink alcohol or use drugs.  Additional Social History:  Alcohol / Drug Use Pain Medications: denies Prescriptions: none Over the Counter: denies History of alcohol / drug use?: Yes Longest period of sobriety (when/how long): 35 days Negative Consequences of Use: Legal,  Personal relationships Withdrawal Symptoms: (none currently) Substance #1 Name of Substance 1: alcohol 1 - Age of First Use: 20 1 - Amount (size/oz): 5-6 16 oz with 8% alcohol 1 - Frequency: daily 1 - Duration: past 2 years 1 - Last Use / Amount: last week  CIWA: CIWA-Ar BP: 106/89 Pulse Rate: 95 Nausea and Vomiting: no nausea and no vomiting Tactile Disturbances: none Tremor: not visible, but can be felt fingertip to fingertip Auditory Disturbances: not present Paroxysmal Sweats: no sweat visible Visual Disturbances: not present Anxiety: mildly anxious Headache, Fullness in Head: none present Agitation: normal activity Orientation and Clouding of Sensorium: oriented and can do serial additions CIWA-Ar Total: 2 COWS:    Allergies:  Allergies  Allergen Reactions  . Penicillins Hives    Has patient had a PCN reaction causing immediate rash, facial/tongue/throat swelling, SOB or lightheadedness with hypotension: Yes Has patient had a PCN reaction causing severe rash involving mucus membranes or skin necrosis: No Has patient had a PCN reaction that required hospitalization No Has patient had a PCN reaction occurring within the last 10 years: No If all of the above answers are "NO", then may proceed with Cephalosporin use.   . Rocephin [Ceftriaxone] Other (See Comments)    Muscle cramps in back (lasted about 8 hrs)    Home Medications:  Medications Prior to Admission  Medication Sig Dispense Refill  . busPIRone (BUSPAR) 15 MG tablet Take 15 mg by mouth 3 (three) times daily as needed.     . Levonorgestrel (KYLEENA) 19.5 MG IUD 1 each by Intrauterine route once.      OB/GYN Status:  No LMP recorded. (Menstrual status: IUD).  General Assessment Data Location of Assessment: [facility] ED TTS Assessment: In system Is this a Tele or Face-to-Face Assessment?: Tele Assessment Is this an Initial Assessment or a Re-assessment for this encounter?: Initial Assessment Marital status:  Single Is patient pregnant?: Unknown Pregnancy Status: Unknown Living Arrangements: Parent, Spouse/significant other(parents, BF) Can pt return to current living arrangement?: Yes Admission Status: Involuntary Is patient capable of signing voluntary admission?: Yes Referral Source: Self/Family/Friend Insurance type: MCD     Crisis Care Plan Living Arrangements: Parent, Spouse/significant other(parents, BF) Name of Psych[facility]rist: [facility] Name of Therapist: [facility]  Education Status Is patient currently in school?: No  Risk to self with the past 6 months Suicidal Ideation: No(Denies, but overdosed) Has patient been a risk to self within the past 6 months prior to admission? : No Suicidal Intent: No(denies but BF says otherwise (see narrative)) Has patient had any suicidal intent within the past 6 months prior to admission? : No(denies, but overdosed) Is patient at risk for suicide?: Yes Suicidal Plan?: Yes-Currently Present(overdosed) Has patient had any suicidal plan within the past 6 months prior to admission? : Other (comment)(overdosed) Access to Means: (medication) What has been your use of drugs/alcohol within the last 12 months?: (ETOH and Seroquel) Previous Attempts/Gestures: No Intentional Self Injurious Behavior: None Family Suicide History: No Recent stressful life event(s): Legal Issues(SA) Persecutory voices/beliefs?: No Depression: Yes Depression Symptoms: Isolating, Loss of interest in usual pleasures, Feeling angry/irritable, Fatigue, Tearfulness Substance abuse  history and/or treatment for substance abuse?: Yes Suicide prevention information given to non-admitted patients: Not applicable  Risk to Others within the past 6 months Homicidal Ideation: No Does patient have any lifetime risk of violence toward others beyond the six months prior to admission? : No Thoughts of Harm to Others: No Current Homicidal Intent: No Access to Homicidal Means:  No History of harm to others?: No Assessment of Violence: On admission(combative) Violent Behavior Description: (combative on arrival due to mental status) Does patient have access to weapons?: No Criminal Charges Pending?: No Does patient have a court date: No Is patient on probation?: No  Psychosis Hallucinations: None noted Delusions: None noted  Mental Status Report Appearance/Hygiene: Unremarkable, In hospital gown Eye Contact: Good Motor Activity: Unremarkable Speech: Logical/coherent Level of Consciousness: Alert Mood: Anxious Affect: Anxious Anxiety Level: Severe Thought Processes: Coherent, Relevant Judgement: Impaired Orientation: Person, Place, Time, Situation, Appropriate for developmental age Obsessive Compulsive Thoughts/Behaviors: None  Cognitive Functioning Concentration: Fair Memory: Recent Intact, Remote Intact Is patient IDD: No Is patient DD?: No Insight: Poor Impulse Control: Poor Appetite: Good Have you had any weight changes? : No Change Sleep: No Change Total Hours of Sleep: 8 Vegetative Symptoms: None  ADLScreening [facility] Patient's cognitive ability adequate to safely complete daily activities?: Yes Patient able to express need for assistance with ADLs?: Yes Independently performs ADLs?: Yes (appropriate for developmental age)  Prior Inpatient Therapy Prior Inpatient Therapy: Yes Prior Therapy Dates: unk Prior Therapy Facilty/Provider(s): [facility] Reason for Treatment: ETOH  Prior Outpatient Therapy Prior Outpatient Therapy: Yes Prior Therapy Dates: [facility] Reason for Treatment: SA Does patient have an ACCT team?: No Does patient have Intensive In-House Services?  : No Does patient have [facility] services? : No Does patient have P4CC services?: No  ADL Screening (condition at time of admission) Patient's cognitive ability adequate to safely complete daily activities?: Yes Is the patient deaf or have difficulty  hearing?: No Does the patient have difficulty seeing, even when wearing glasses/contacts?: No Does the patient have difficulty concentrating, remembering, or making decisions?: No Patient able to express need for assistance with ADLs?: Yes Does the patient have difficulty dressing or bathing?: No Independently performs ADLs?: Yes (appropriate for developmental age) Does the patient have difficulty walking or climbing stairs?: No Weakness of Legs: None Weakness of Arms/Hands: None  Home Assistive Devices/Equipment Home Assistive Devices/Equipment: None  Therapy Consults (therapy consults require a physician order) PT Evaluation Needed: No OT Evalulation Needed: No SLP Evaluation Needed: No Abuse/Neglect Assessment (Assessment to be complete while patient is alone) Abuse/Neglect Assessment Can Be Completed: Yes Physical Abuse: Denies Verbal Abuse: Denies Sexual Abuse: Denies Exploitation of patient/patient's resources: Denies Self-Neglect: Denies Values / Beliefs Cultural Requests During Hospitalization: None Spiritual Requests During Hospitalization: None Consults Spiritual Care Consult Needed: No Social Work Consult Needed: No Merchant navy officerAdvance Directives (For Healthcare) Does Patient Have a Medical Advance Directive?: No Would patient like information on creating a medical advance directive?: No - Patient declined Nutrition Screen- [facility] Patient's home diet: Regular Has the patient recently lost weight without trying?: No Has the patient been eating poorly because of a decreased appetite?: No Malnutrition Screening Tool Score: 0     Child/Adolescent Assessment Running Away Risk: Denies  Disposition:  Disposition Initial Assessment Completed for this Encounter: Yes  This service was provided via telemedicine using a 2-way, interactive audio and video technology.  Names of all persons participating in this telemedicine service and their role in this encounter. Name:  Irving Burtonmily  Rennie Plowman, TTS counselor Role: assessment             Stephanie Mcglone Hines 02/07/2018 3:00 PM

## 2018-02-07 NOTE — Discharge Summary (Signed)
Physician Discharge Summary  Domenick BookbinderLeah J Poirier [name]:[id number] DOB: [birth year] DOA: 02/05/2018  PCP: Patient, No Pcp Per  Admit date: 02/05/2018 Discharge date: 02/07/2018  Time spent: >35 minutes  Recommendations for Outpatient Follow-up:  Psychiatry hospital  MD at the facility   Discharge Diagnoses:  Principal Problem:   Alcohol withdrawal delirium, acute, hyperactive (HCC) Active Problems:   Insomnia   Overdose   Delirium   Discharge Condition: stable   Diet recommendation: regular   Filed Weights   02/05/18 1830 02/06/18 0500 02/07/18 0500  Weight: 79.7 kg (175 lb 11.3 oz) 79.4 kg (175 lb 0.7 oz) 81.2 kg (179 lb 0.2 oz)    History of present illness:   27 y.o.femalewith a PMH of alcohol abuse, insomnia, and suicidal behaviors who was admitted 02/05/18 with an intentional overdose of Seroquel tablets witnessed by her boyfriend. After taking the Seroquel, the patient began to hallucinate and become agitated. Her last use of alcohol was 4-5 days ago. Upon initial evaluation in the ED, the patient was anxious and agitated tachycardia. Poison control was contacted as well.    Hospital Course:   Alcohol withdrawal delirium, acute, hyperactive (HCC). The patient was admitted to the stepdown unit and placed on an Ativan detox protocol. She was noted to be agitated with hallucinations overnight. She required restraint for safety. She was ultimately placed on a Precedex infusion and also required Haldol and Versed. Safety sitter is at the bedside. Weaned from precedex 3/27. Transitioned to short course of librium. Psychiatry consultation. Involuntarily committed secondary to wanting to leave the hospital AMA. -patient is being discharged to inpatient psychiatry treatment facility for further management   Insomnia. Continue supportive care.  Overdose. Overdosed on Seroquel. Poison control contacted. Supportive care. Monitor QT interval. EKG personally reviewed.QTC currently  455 ms.  Suicidal attempt. Patient is under IVC. Cont safety monitor. Suicidal precautions. Consulted psychiatry eval. Defer further management to psychiatry   Body mass index is 25.85 kg/m.  Thrombocytopenia. Likely due to alcohol use. No s/s of bleeding.   patient is medically stable for inpatient psychiatry admission or transfer     Procedures:  none (i.e. Studies not automatically included, echos, thoracentesis, etc; not x-rays)  Consultations:  psychiatry   Discharge Exam: Vitals:   02/07/18 1400 02/07/18 1500  BP: (!) 138/112 (!) 139/106  Pulse: (!) 38 95  Resp: 19 17  Temp:    SpO2: 95% 96%    General: no distress  Cardiovascular: s1,s2 rrr Respiratory: CTA BL  Discharge Instructions  Discharge Instructions    Diet - low sodium heart healthy   Complete by:  As directed    Increase activity slowly   Complete by:  As directed      Allergies as of 02/07/2018      Reactions   Penicillins Hives   Has patient had a PCN reaction causing immediate rash, facial/tongue/throat swelling, SOB or lightheadedness with hypotension: Yes Has patient had a PCN reaction causing severe rash involving mucus membranes or skin necrosis: No Has patient had a PCN reaction that required hospitalization No Has patient had a PCN reaction occurring within the last 10 years: No If all of the above answers are "NO", then may proceed with Cephalosporin use.   Rocephin [ceftriaxone] Other (See Comments)   Muscle cramps in back (lasted about 8 hrs)      Medication List    STOP taking these medications   busPIRone 15 MG tablet Commonly known as:  BUSPAR     TAKE  these medications   chlordiazePOXIDE 25 MG capsule Commonly known as:  LIBRIUM Take 1 capsule (25 mg total) by mouth 3 (three) times daily for 2 days.   folic acid 1 MG tablet Commonly known as:  FOLVITE Take 1 tablet (1 mg total) by mouth daily. Start taking on:  02/08/2018   KYLEENA 19.5 MG Iud Generic drug:   Levonorgestrel 1 each by Intrauterine route once.   thiamine 100 MG tablet Take 1 tablet (100 mg total) by mouth daily. Start taking on:  02/08/2018      Allergies  Allergen Reactions  . Penicillins Hives    Has patient had a PCN reaction causing immediate rash, facial/tongue/throat swelling, SOB or lightheadedness with hypotension: Yes Has patient had a PCN reaction causing severe rash involving mucus membranes or skin necrosis: No Has patient had a PCN reaction that required hospitalization No Has patient had a PCN reaction occurring within the last 10 years: No If all of the above answers are "NO", then may proceed with Cephalosporin use.   . Rocephin [Ceftriaxone] Other (See Comments)    Muscle cramps in back (lasted about 8 hrs)      The results of significant diagnostics from this hospitalization (including imaging, microbiology, ancillary and laboratory) are listed below for reference.    Significant Diagnostic Studies: No results found.  Microbiology: Recent Results (from the past 240 hour(s))  MRSA PCR Screening     Status: None   Collection Time: 02/06/18 12:09 AM  Result Value Ref Range Status   MRSA by PCR NEGATIVE NEGATIVE Final    Comment:        The GeneXpert MRSA Assay (FDA approved for NASAL specimens only), is one component of a comprehensive MRSA colonization surveillance program. It is not intended to diagnose MRSA infection nor to guide or monitor treatment for MRSA infections. Performed at [facility], [address] [place], [place] [address]      Labs: Basic Metabolic Panel: Recent Labs  Lab 02/05/18 1325 02/05/18 1335 02/06/18 0413  NA 136  --  138  K 3.7  --  3.6  CL 100*  --  105  CO2 25  --  24  GLUCOSE 133*  --  139*  BUN 12  --  10  CREATININE 0.92  --  0.92  CALCIUM 9.2  --  8.3*  MG  --  1.5* 2.5*   Liver Function Tests: Recent Labs  Lab 02/05/18 1325  AST 43*  ALT 95*  ALKPHOS 124  BILITOT 1.4*  PROT 7.4   ALBUMIN 3.9   No results for input(s): LIPASE, AMYLASE in the last 168 hours. No results for input(s): AMMONIA in the last 168 hours. CBC: Recent Labs  Lab 02/05/18 1325 02/06/18 0413  WBC 5.1 5.5  NEUTROABS 3.4  --   HGB 12.5 11.9*  HCT 37.9 36.8  MCV 92.4 94.1  PLT 93* 89*   Cardiac Enzymes: Recent Labs  Lab 02/06/18 0413  CKTOTAL 141   BNP: BNP (last 3 results) No results for input(s): BNP in the last 8760 hours.  ProBNP (last 3 results) No results for input(s): PROBNP in the last 8760 hours.  CBG: Recent Labs  Lab 02/06/18 0727 02/07/18 0750  GLUCAP 118* 97       Signed:  Jonette Mate N  [facility] 02/07/2018, 4:02 PM

## 2018-02-07 NOTE — Clinical Social Work Note (Signed)
Pt remains here under IVC. If pt is medically clear, TTS should consult and determine disposition. IVC was placed for 72 hours. It will expire Friday 02/09/18 at 10AM if it is not rescinded prior. If it needs to be renewed, will assist at that time.

## 2018-02-08 ENCOUNTER — Encounter (HOSPITAL_COMMUNITY): Payer: Self-pay

## 2018-02-08 ENCOUNTER — Other Ambulatory Visit: Payer: Self-pay

## 2018-02-08 DIAGNOSIS — T43592A Poisoning by other antipsychotics and neuroleptics, intentional self-harm, initial encounter: Secondary | ICD-10-CM

## 2018-02-08 DIAGNOSIS — G4700 Insomnia, unspecified: Secondary | ICD-10-CM

## 2018-02-08 DIAGNOSIS — F419 Anxiety disorder, unspecified: Secondary | ICD-10-CM

## 2018-02-08 DIAGNOSIS — F322 Major depressive disorder, single episode, severe without psychotic features: Principal | ICD-10-CM

## 2018-02-08 DIAGNOSIS — F1020 Alcohol dependence, uncomplicated: Secondary | ICD-10-CM

## 2018-02-08 DIAGNOSIS — T50902A Poisoning by unspecified drugs, medicaments and biological substances, intentional self-harm, initial encounter: Secondary | ICD-10-CM

## 2018-02-08 DIAGNOSIS — F329 Major depressive disorder, single episode, unspecified: Secondary | ICD-10-CM | POA: Diagnosis present

## 2018-02-08 DIAGNOSIS — T1491XA Suicide attempt, initial encounter: Secondary | ICD-10-CM

## 2018-02-08 LAB — HEPATITIS PANEL, ACUTE
HEP A IGM: NEGATIVE
HEP B C IGM: NEGATIVE
Hepatitis B Surface Ag: NEGATIVE

## 2018-02-08 MED ORDER — MAGNESIUM HYDROXIDE 400 MG/5ML PO SUSP: 30.0000 mL | Freq: Every day | ORAL | Status: DC | PRN

## 2018-02-08 MED ORDER — LOPERAMIDE HCL 2 MG PO CAPS: 2.0000 mg | ORAL_CAPSULE | ORAL | Status: DC | PRN

## 2018-02-08 MED ORDER — TRAZODONE HCL 50 MG PO TABS
50.0000 mg | ORAL_TABLET | Freq: Every evening | ORAL | Status: DC | PRN
  Administered 2018-02-08: 50 mg via ORAL
  Filled 2018-02-08: qty 1

## 2018-02-08 MED ORDER — ACETAMINOPHEN 325 MG PO TABS: 650.0000 mg | ORAL_TABLET | Freq: Four times a day (QID) | ORAL | Status: DC | PRN

## 2018-02-08 MED ORDER — ADULT MULTIVITAMIN W/MINERALS CH
1.0000 | ORAL_TABLET | Freq: Every day | ORAL | Status: DC
  Administered 2018-02-08 – 2018-02-09 (×2): 1 via ORAL
  Filled 2018-02-08 (×3): qty 1

## 2018-02-08 MED ORDER — SERTRALINE HCL 50 MG PO TABS
25.0000 mg | ORAL_TABLET | Freq: Every day | ORAL | Status: DC
  Administered 2018-02-08 – 2018-02-09 (×2): 25 mg via ORAL
  Filled 2018-02-08: qty 1
  Filled 2018-02-08: qty 0.5
  Filled 2018-02-08 (×2): qty 1

## 2018-02-08 MED ORDER — THIAMINE HCL 100 MG/ML IJ SOLN: 100.0000 mg | Freq: Once | INTRAMUSCULAR | Status: DC

## 2018-02-08 MED ORDER — ONDANSETRON 4 MG PO TBDP: 4.0000 mg | ORAL_TABLET | Freq: Four times a day (QID) | ORAL | Status: DC | PRN

## 2018-02-08 MED ORDER — VITAMIN B-1 100 MG PO TABS
100.0000 mg | ORAL_TABLET | Freq: Every day | ORAL | Status: DC
  Administered 2018-02-09: 100 mg via ORAL
  Filled 2018-02-08 (×2): qty 1

## 2018-02-08 MED ORDER — ALUM & MAG HYDROXIDE-SIMETH 200-200-20 MG/5ML PO SUSP: 30.0000 mL | ORAL | Status: DC | PRN

## 2018-02-08 MED ORDER — LORAZEPAM 1 MG PO TABS
1.0000 mg | ORAL_TABLET | Freq: Four times a day (QID) | ORAL | Status: DC | PRN
  Administered 2018-02-08: 1 mg via ORAL
  Filled 2018-02-08: qty 1

## 2018-02-08 MED ORDER — HYDROXYZINE HCL 25 MG PO TABS
25.0000 mg | ORAL_TABLET | Freq: Four times a day (QID) | ORAL | Status: DC | PRN
  Administered 2018-02-08 – 2018-02-09 (×4): 25 mg via ORAL
  Filled 2018-02-08 (×5): qty 1

## 2018-02-08 NOTE — Progress Notes (Signed)
  DATA ACTION RESPONSE  Objective- Pt. is visible in the dayroom, seen eating a snack. Pt attended wrap-up group this evening. Presents with an anxious affect and mood. Pt states she is hoping to be d/c tomorrow to spend time with family before [facility]   Subjective- Denies having any SI/HI/AVH/Pain at this time. Is cooperative and remains safe on the unit.  1:1 interaction in private to establish rapport. Encouragement, education, & support given from staff.  PRN vistaril and trazodone requested and will re-eval accordingly.   Safety maintained with Q 15 checks. Continue with POC.

## 2018-02-08 NOTE — Tx Team (Signed)
Initial Treatment Plan 02/08/2018 2:55 AM Stephanie BookbinderLeah J Smitherman [name]:[id number]    PATIENT STRESSORS: Legal issue Substance abuse   PATIENT STRENGTHS: Average or above average intelligence General fund of knowledge Supportive family/friends   PATIENT IDENTIFIED PROBLEMS: Substance abuse "I need to get help to stop the drug use"  "Anxiety " I need help dealing with my anxiety                   DISCHARGE CRITERIA:  Ability to meet basic life and health needs Adequate post-discharge living arrangements Improved stabilization in mood, thinking, and/or behavior Medical problems require only outpatient monitoring  PRELIMINARY DISCHARGE PLAN: Attend aftercare/continuing care group Outpatient therapy  PATIENT/FAMILY INVOLVEMENT: This treatment plan has been presented to and reviewed with the patient, Stephanie BookbinderLeah J Mercado   The patient has been given the opportunity to ask questions and make suggestions.  Sylvan CheeseSteven M Geisha Abernathy, RN 02/08/2018, 2:55 AM

## 2018-02-08 NOTE — Progress Notes (Signed)
Pt is 27 year old female who ingested 8 Seroquel [facility].  Pt sts she was experiencing hallucinations, periods of anger and depression.  Pt denies that she was trying to OD.  Pt denies pain or discomfort.  [facility].  Pt denies SI, HI or AVH and contracts for safety.  Pt is already asking about medications that she wants. Pt advised to go to bed and to see provider in the am.   Pt with q 15 min checks for safety.  Pt remains safe.

## 2018-02-08 NOTE — [facility] Suicide Risk Assessment (Addendum)
[facility] INPATIENT:  Family/Significant Other Suicide Prevention Education  Suicide Prevention Education:  Contact Attempts: Stephanie ParadiseSheila Mercado, mother [phone]) has been identified by the patient as the family member/significant other with whom the patient will be residing, and identified as the person(s) who will aid the patient in the event of a mental health crisis.  With written consent from the patient, two attempts were made to provide suicide prevention education, prior to and/or following the patient's discharge.  We were unsuccessful in providing suicide prevention education.  A suicide education pamphlet was given to the patient to share with family/significant other.  Date and time of first attempt:02/08/18 / 2:56pm   Stephanie SarahJolan E Mercado 02/08/2018, 2:58 PM   02/09/18 at 10:46AM   Stephanie SladeHeather Smart, MSW, LCSW Clinical Social Worker 02/09/2018 10:46 AM

## 2018-02-08 NOTE — Progress Notes (Signed)
Patient was invited but did not attend the 4:00 PM Crisis Management Psychoeducational Group led by RNs. 

## 2018-02-08 NOTE — Progress Notes (Signed)
DAR NOTE: Patient presents with anxious affect and labile mood.  Pt has been observed in the day room interacting with peer. Pt reported good sleep, good appetite, normal energy, and good concentration. Denies pain, auditory and visual hallucinations.  Rates depression at 2, hopelessness at 0, and anxiety at 6.  Maintained on routine safety checks.  Medications given as prescribed.  Support and encouragement offered as needed.  States goal for today is " Coping mechanisms and identify triggers.".  Patient observed socializing with peers in the dayroom.  Offered no complaint.

## 2018-02-08 NOTE — H&P (Signed)
Psychiatric Admission Assessment Adult  Patient Identification: Stephanie BookbinderLeah J Mercado MRN:  [id number] Date of Evaluation:  02/08/2018 Chief Complaint:  MDD   alcohol use disorder  Principal Diagnosis: <principal problem not specified> Diagnosis:   Patient Active Problem List   Diagnosis Date Noted  . MDD (major depressive disorder), severe (HCC) [F32.2] 02/08/2018  . Alcohol withdrawal delirium, acute, hyperactive (HCC) [F10.231] 02/05/2018  . Overdose [T50.901A] 02/05/2018  . Delirium [R41.0] 02/05/2018  . Insomnia [G47.00] 10/12/2015  . Skin lesion of back [L98.9] 07/25/2014  . ADD (attention deficit disorder) [F98.8] 03/31/2014  . Allergic rhinitis [J30.9] 03/31/2014  . Migraine headache with aura [G43.109] 02/15/2013   History of Present Illness: Patient is seen and examined.  Patient is a 27 year old female with a past psychiatric history significant for alcohol use disorder and major depression who was transferred from an outside hospital after an intentional overdose of Seroquel.  The patient was scheduled to be admitted to [facility] for substance abuse rehabilitation, and decided that she wanted to "get high 1 last time".  She took 2-3 Seroquel 200 mg tablets, and felt strange.  She decided take additional medication, and then began to hallucinate.  She called for help at that time.  She was transferred to the outside hospital.  She stated that she was not trying to kill herself, but that the hospital felt as though she was in alcohol withdrawal at that time.  She denied any recent alcohol.  She stated that she had only drank 5 times since her hospitalization in February at [facility].  She was detoxed for the second time within 6 months at their facility.  After her agitation and hallucinations had resolved she was transferred to our facility for evaluation and stabilization.  She stated she was just trying to get high and was not trying to kill herself.  She stated she only had 5 drinks  of alcohol since the discharge from [facility] on 2/8.  She was scheduled to be admitted to [facility] yesterday, but that is been moved back to next week.  She stated she was only taking the Seroquel for sleep, and was on no other medications.  She denied any suicidal, homicidal ideation.  She denied any other substance abuse.  She stated the last marijuana she had was in December.  She stated prior to becoming sober she would drink 6-7 cans of Mike's hard lemonade daily.  She was admitted to our facility for evaluation and stabilization. She had been previously admitted to our facility last year, and had been at [facility] for detox in December 2018, and then after she presented to a court hearing on February 6 or 7 she was admitted to [facility].  She was swabbed for alcohol at the court room, and found that she had been drinking. Associated Signs/Symptoms: Depression Symptoms:  insomnia, anxiety, (Hypo) Manic Symptoms:  Impulsivity, Anxiety Symptoms:  Excessive Worry, Psychotic Symptoms:  She hallucinated after her overdose of the Seroquel. PTSD Symptoms: NA Total Time spent with patient: 45 minutes  Past Psychiatric History: Patient has had at least 3 psychiatric hospitalizations.  One at [facility], one at [facility] for detox in December 2018, and 1 at [facility] for detox.  Is the patient at risk to self? No.  Has the patient been a risk to self in the past 6 months? No.  Has the patient been a risk to self within the distant past? No.  Is the patient a risk to others?  No.  Has the patient been a risk to others in the past 6 months? No.  Has the patient been a risk to others within the distant past? No.   Prior Inpatient Therapy:   Prior Outpatient Therapy:    Alcohol Screening: Patient refused Alcohol Screening Tool: Yes Intervention/Follow-up: Patient Refused Substance Abuse History in the last 12 months:  Yes.   Consequences of Substance Abuse: Medical  Consequences:  Abnormal liver function enzymes Previous Psychotropic Medications: Yes  Psychological Evaluations: Yes  Past Medical History:  Past Medical History:  Diagnosis Date  . Allergy   . Eczema   . Migraines     Past Surgical History:  Procedure Laterality Date  . TONSILLECTOMY    . WISDOM TOOTH EXTRACTION     Family History:  Family History  Problem Relation Age of Onset  . Cancer Mother   . Kidney disease Mother   . Diabetes Paternal Grandfather    Family Psychiatric  History: Mother has a history of alcohol use disorder, but has been clean for 6 months, father is a history of opiate disorder and has been clean for 8 months, mom also suffers anxiety and is being treated with Zoloft. Tobacco Screening: Have you used any form of tobacco in the last 30 days? (Cigarettes, Smokeless Tobacco, Cigars, and/or Pipes): No Social History:  Social History   Substance and Sexual Activity  Alcohol Use No  . Frequency: Never   Comment: rehab     Social History   Substance and Sexual Activity  Drug Use No    Additional Social History: Marital status: Long term relationship Long term relationship, how long?: 10 months  What types of issues is patient dealing with in the relationship?: Patient reports the only issue is her alcoholism.  Additional relationship information: No Are you sexually active?: Yes What is your sexual orientation?: Heterosexual  Has your sexual activity been affected by drugs, alcohol, medication, or emotional stress?: No  Does patient have children?: No    Pain Medications: denies Prescriptions: none Over the Counter: denies History of alcohol / drug use?: Yes Longest period of sobriety (when/how long): 35 days Negative Consequences of Use: Legal, Personal relationships Withdrawal Symptoms: Other (Comment)(none) Name of Substance 1: alcohol 1 - Age of First Use: 20 1 - Amount (size/oz): 5-6 16 oz with 8% alcohol 1 - Frequency: daily 1 -  Duration: past 2 years 1 - Last Use / Amount: last week                  Allergies:   Allergies  Allergen Reactions  . Penicillins Hives    Has patient had a PCN reaction causing immediate rash, facial/tongue/throat swelling, SOB or lightheadedness with hypotension: Yes Has patient had a PCN reaction causing severe rash involving mucus membranes or skin necrosis: No Has patient had a PCN reaction that required hospitalization No Has patient had a PCN reaction occurring within the last 10 years: No If all of the above answers are "NO", then may proceed with Cephalosporin use.   . Rocephin [Ceftriaxone] Other (See Comments)    Muscle cramps in back (lasted about 8 hrs)   Lab Results:  Results for orders placed or performed during the hospital encounter of 02/05/18 (from the past 48 hour(s))  Hepatitis panel, acute     Status: None   Collection Time: 02/07/18  4:06 AM  Result Value Ref Range   Hepatitis B Surface Ag Negative Negative   HCV Ab <0.1 0.0 -  0.9 s/co ratio    Comment: (NOTE)                                  Negative:     < 0.8                             Indeterminate: 0.8 - 0.9                                  Positive:     > 0.9 The CDC recommends that a positive HCV antibody result be followed up with a HCV Nucleic Acid Amplification test (161096). Performed At: [facility] [address] [place], [place] [id number] Jolene Schimke MD BJ:[id number]    Hep A IgM Negative Negative   Hep B C IgM Negative Negative    Comment: Performed at [facility], [address], [place], [place] [address]  Glucose, capillary     Status: None   Collection Time: 02/07/18  7:50 AM  Result Value Ref Range   Glucose-Capillary 97 65 - 99 mg/dL   Comment 1 Notify RN   TSH     Status: None   Collection Time: 02/07/18  9:38 AM  Result Value Ref Range   TSH 1.663 0.350 - 4.500 uIU/mL    Comment: Performed by a 3rd Generation assay with a functional sensitivity of  <=0.01 uIU/mL. Performed at [facility], [address], [place], [place] [address]     Blood Alcohol level:  Lab Results  Component Value Date   [facility] <10 02/05/2018    Metabolic Disorder Labs:  No results found for: HGBA1C, MPG No results found for: PROLACTIN No results found for: CHOL, TRIG, HDL, CHOLHDL, VLDL, LDLCALC  Current Medications: Current Facility-Administered Medications  Medication Dose Route Frequency Provider Last Rate Last Dose  . acetaminophen (TYLENOL) tablet 650 mg  650 mg Oral Q6H PRN Rankin, Shuvon B, NP      . alum & mag hydroxide-simeth (MAALOX/MYLANTA) 200-200-20 MG/5ML suspension 30 mL  30 mL Oral Q4H PRN Rankin, Shuvon B, NP      . hydrOXYzine (ATARAX/VISTARIL) tablet 25 mg  25 mg Oral Q6H PRN Rankin, Shuvon B, NP   25 mg at 02/08/18 0018  . loperamide (IMODIUM) capsule 2-4 mg  2-4 mg Oral PRN Rankin, Shuvon B, NP      . LORazepam (ATIVAN) tablet 1 mg  1 mg Oral Q6H PRN Rankin, Shuvon B, NP   1 mg at 02/08/18 0018  . magnesium hydroxide (MILK OF MAGNESIA) suspension 30 mL  30 mL Oral Daily PRN Rankin, Shuvon B, NP      . multivitamin with minerals tablet 1 tablet  1 tablet Oral Daily Rankin, Shuvon B, NP   1 tablet at 02/08/18 0757  . ondansetron (ZOFRAN-ODT) disintegrating tablet 4 mg  4 mg Oral Q6H PRN Rankin, Shuvon B, NP      . sertraline (ZOLOFT) tablet 25 mg  25 mg Oral Daily Antonieta Pert, MD      . thiamine (B-1) injection 100 mg  100 mg Intramuscular Once Rankin, Shuvon B, NP      . [START ON 02/09/2018] thiamine (VITAMIN B-1) tablet 100 mg  100 mg Oral Daily Rankin, Shuvon B, NP      . traZODone (DESYREL) tablet 50 mg  50 mg Oral QHS PRN Antonieta Pert, MD       PTA Medications: Medications Prior to Admission  Medication Sig Dispense Refill Last Dose  . chlordiazePOXIDE (LIBRIUM) 25 MG capsule Take 1 capsule (25 mg total) by mouth 3 (three) times daily for 2 days. 6 capsule 0   . folic acid (FOLVITE) 1 MG tablet Take 1 tablet (1 mg  total) by mouth daily.     . Levonorgestrel (KYLEENA) 19.5 MG IUD 1 each by Intrauterine route once.   01/27/2017 at Unknown time  . thiamine 100 MG tablet Take 1 tablet (100 mg total) by mouth daily.       Musculoskeletal: Strength & Muscle Tone: within normal limits Gait & Station: normal Patient leans: N/A  Psychiatric Specialty Exam: Physical Exam  Nursing note and vitals reviewed. Constitutional: She is oriented to person, place, and time. She appears well-developed and well-nourished.  HENT:  Head: Normocephalic.  Neurological: She is alert and oriented to person, place, and time.    ROS  Blood pressure 132/84, pulse (!) 116, temperature 98.3 F (36.8 C), temperature source Oral, resp. rate 18, height 5\' 9"  (1.753 m), weight 81.2 kg (179 lb).Body mass index is 26.43 kg/m.  General Appearance: Casual  Eye Contact:  Good  Speech:  Pressured  Volume:  Increased  Mood:  Anxious  Affect:  Congruent  Thought Process:  Coherent  Orientation:  Full (Time, Place, and Person)  Thought Content:  Logical  Suicidal Thoughts:  No  Homicidal Thoughts:  No  Memory:  Immediate;   Good  Judgement:  Fair  Insight:  Fair  Psychomotor Activity:  Increased  Concentration:  Concentration: Fair  Recall:  Good  Fund of Knowledge:  Fair  Language:  Good  Akathisia:  No  Handed:  Right  AIMS (if indicated):     Assets:  Communication Skills Desire for Improvement Housing Physical Health Resilience Social Support Talents/Skills Transportation  ADL's:  Intact  Cognition:  WNL  Sleep:  Number of Hours: 6    Treatment Plan Summary: Daily contact with patient to assess and evaluate symptoms and progress in treatment, Medication management and Plan Patient is seen and examined.  Patient is a 27 year old female with the above-stated past psychiatric history of alcohol use disorder and major depression who was admitted on transfer after an intentional overdose of Seroquel.  This was done  in attempt to "get high".  She denied any suicidal ideation.  She does remain significantly anxious, and we discussed this in detail.  She has agreed to a trial of Zoloft.  We will give her 25 mg p.o. daily today, and if she tolerates it increase it to 50 mg tomorrow.  She also had been taking Seroquel for sleep.  Given the overdose I do not think that is a good idea.  I am a put her on trazodone 50 mg p.o. nightly and see if that helps her insomnia.  We will get a collateral information and make sure that what she saying is true and that this was an attempt to get high and not to kill herself.  The outside hospital said she had delirium tremens, but she stated her alcohol intake had been minimal.  Review of her laboratories shows that her blood alcohol on admission was 0.  Again we will need to get collateral information for that to confirm it.  I will keep her on alcohol withdrawal precautions at this point to make sure.  Hopefully we will  be able to get the collateral information and discern all the above so that we can get her to [facility] next week on her scheduled date.  Observation Level/Precautions:  15 minute checks  Laboratory:  CBC Chemistry Profile HCG UDS UA  Psychotherapy:    Medications:    Consultations:    Discharge Concerns:    Estimated LOS:  Other:     Physician Treatment Plan for Primary Diagnosis: #1 observe for alcohol withdrawal syndrome, #2 confirm that this was an overdose that was intentional for altered mental status and not for suicide, #3 treat anxiety with Zoloft, #4 confirmed collateral information so that the patient may be able to be discharged prior to her admission dated [facility] for substance abuse treatment. Long Term Goal(s): Improvement in symptoms so as ready for discharge  Short Term Goals: Ability to identify changes in lifestyle to reduce recurrence of condition will improve, Ability to verbalize feelings will improve, Ability to disclose and  discuss suicidal ideas, Ability to demonstrate self-control will improve, Ability to identify and develop effective coping behaviors will improve, Ability to maintain clinical measurements within normal limits will improve, Compliance with prescribed medications will improve and Ability to identify triggers associated with substance abuse/mental health issues will improve  Physician Treatment Plan for Secondary Diagnosis: Active Problems:   MDD (major depressive disorder), severe (HCC)  Long Term Goal(s): Improvement in symptoms so as ready for discharge  Short Term Goals: Ability to identify changes in lifestyle to reduce recurrence of condition will improve, Ability to verbalize feelings will improve, Ability to disclose and discuss suicidal ideas, Ability to demonstrate self-control will improve, Ability to identify and develop effective coping behaviors will improve, Ability to maintain clinical measurements within normal limits will improve, Compliance with prescribed medications will improve and Ability to identify triggers associated with substance abuse/mental health issues will improve  I certify that inpatient services furnished can reasonably be expected to improve the patient's condition.    Antonieta Pert, MD 3/28/201910:48 AM
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
## 2018-02-08 NOTE — BHH Suicide Risk Assessment (Signed)
[facility]   Nursing information obtained from:    Demographic factors:    Current Mental Status:    Loss Factors:    Historical Factors:    Risk Reduction Factors:     Total Time spent with patient: 45 minutes Principal Problem: <principal problem not specified> intentional overdose, history of alcohol use disorder in partial remission, major depression, recurrent, generalized anxiety disorder. Diagnosis:   Patient Active Problem List   Diagnosis Date Noted  . MDD (major depressive disorder), severe (HCC) [F32.2] 02/08/2018  . Alcohol withdrawal delirium, acute, hyperactive (HCC) [F10.231] 02/05/2018  . Overdose [T50.901A] 02/05/2018  . Delirium [R41.0] 02/05/2018  . Insomnia [G47.00] 10/12/2015  . Skin lesion of back [L98.9] 07/25/2014  . ADD (attention deficit disorder) [F98.8] 03/31/2014  . Allergic rhinitis [J30.9] 03/31/2014  . Migraine headache with aura [G43.109] 02/15/2013   Subjective Data: Patient is seen and examined.  Patient is a 27 year old female with the above-stated past psychiatric history who reports that she took an intentional overdose of Seroquel and attempt to get high.  She suffered psychiatric consequences of this.  She had hallucinations.  She was transferred to a local hospital and treated for possible delirium tremens.  Once her psychotic symptoms had resolved she was transferred to our facility.  She stated that she had not had significant alcohol prior to this event and had taken an overdose of Seroquel and attempt to get high.  She had had the Seroquel prescribed for insomnia.  She did admit to significant anxiety.  [facility] abuse [facility] yesterday, but has had that date rescheduled.  Continued Clinical Symptoms:    The "Alcohol Use Disorders Identification Test", Guidelines for Use in Primary Care, Second Edition.  World Science writerHealth Organization [facility](WHO). Score between 0-7:   no or low risk or alcohol related problems. Score between 8-15:  moderate risk of alcohol related problems. Score between 16-19:  high risk of alcohol related problems. Score 20 or above:  warrants further diagnostic evaluation for alcohol dependence and treatment.   CLINICAL FACTORS:   Severe Anxiety and/or Agitation Alcohol/Substance Abuse/Dependencies   Musculoskeletal: Strength & Muscle Tone: within normal limits Gait & Station: normal Patient leans: N/A  Psychiatric Specialty Exam: Physical Exam  Nursing note and vitals reviewed. Constitutional: She is oriented to person, place, and time. She appears well-developed and well-nourished.  HENT:  Head: Normocephalic.  Neurological: She is alert and oriented to person, place, and time.    ROS  Blood pressure 132/84, pulse (!) 116, temperature 98.3 F (36.8 C), temperature source Oral, resp. rate 18, height 5\' 9"  (1.753 m), weight 81.2 kg (179 lb).Body mass index is 26.43 kg/m.  General Appearance: Casual  Eye Contact:  Good  Speech:  Pressured  Volume:  Increased  Mood:  Anxious  Affect:  Congruent  Thought Process:  Coherent  Orientation:  Full (Time, Place, and Person)  Thought Content:  Logical  Suicidal Thoughts:  No  Homicidal Thoughts:  No  Memory:  Immediate;   Good  Judgement:  Impaired  Insight:  Lacking  Psychomotor Activity:  Increased  Concentration:  Concentration: Fair  Recall:  Fair  Fund of Knowledge:  Good  Language:  Good  Akathisia:  No  Handed:  Right  AIMS (if indicated):     Assets:  Communication Skills Desire for Improvement Housing Physical Health Resilience Social Support Talents/Skills  ADL's:  Intact  Cognition:  WNL  Sleep:  Number of Hours: 6      COGNITIVE FEATURES THAT CONTRIBUTE TO RISK:  None    SUICIDE RISK:   Minimal: No identifiable suicidal ideation.  Patients presenting with no risk factors but with morbid ruminations; may be classified as minimal risk based on  the severity of the depressive symptoms  PLAN OF CARE: Patient is seen and examined.  Patient is a 27 year old female with the above-stated past psychiatric history is seen in follow-up.  There was concern that this was a suicide attempt.  She denies this, and stated it was an attempt to become altered.  We will monitor for alcohol withdrawal symptoms.  We will contact her family for collateral information to make sure about her substance abuse as well as  her mood.  We are going to start Zoloft for her anxiety, and I am going to stop the Seroquel and place her on trazodone for sleep.  We will keep her on suicide precautions.  I certify that inpatient services furnished can reasonably be expected to improve the patient's condition.   Antonieta Pert, MD 02/08/2018, 11:16 AM

## 2018-02-08 NOTE — BHH Group Notes (Signed)
LCSW Group Therapy Note   02/08/2018 1:15pm   Type of Therapy and Topic:  Group Therapy:  Overcoming Obstacles   Participation Level:  Did Not Attend--invited. Chose to remain in bed.    Description of Group:    In this group patients will be encouraged to explore what they see as obstacles to their own wellness and recovery. They will be guided to discuss their thoughts, feelings, and behaviors related to these obstacles. The group will process together ways to cope with barriers, with attention given to specific choices patients can make. Each patient will be challenged to identify changes they are motivated to make in order to overcome their obstacles. This group will be process-oriented, with patients participating in exploration of their own experiences as well as giving and receiving support and challenge from other group members.   Therapeutic Goals: 1. Patient will identify personal and current obstacles as they relate to admission. 2. Patient will identify barriers that currently interfere with their wellness or overcoming obstacles.  3. Patient will identify feelings, thought process and behaviors related to these barriers. 4. Patient will identify two changes they are willing to make to overcome these obstacles:      Summary of Patient Progress   x   Therapeutic Modalities:   Cognitive Behavioral Therapy Solution Focused Therapy Motivational Interviewing Relapse Prevention Therapy  Ledell PeoplesHeather N Smart, LCSW 02/08/2018 2:37 PM

## 2018-02-09 MED ORDER — TRAZODONE HCL 50 MG PO TABS: 50.0000 mg | ORAL_TABLET | Freq: Every evening | ORAL | Status: DC | PRN

## 2018-02-09 MED ORDER — SERTRALINE HCL 25 MG PO TABS: 25.0000 mg | ORAL_TABLET | Freq: Every day | ORAL | 0 refills | Status: DC

## 2018-02-09 MED ORDER — HYDROXYZINE HCL 25 MG PO TABS: 25.0000 mg | ORAL_TABLET | Freq: Four times a day (QID) | ORAL | 0 refills | Status: DC | PRN

## 2018-02-09 MED ORDER — SERTRALINE HCL 25 MG PO TABS
25.0000 mg | ORAL_TABLET | Freq: Every day | ORAL | Status: DC
  Filled 2018-02-09: qty 30

## 2018-02-09 NOTE — Progress Notes (Signed)
Pt attend wrap up group. Her day was a 8. Her goal was to work on her coping skills toward her discharge.

## 2018-02-09 NOTE — Progress Notes (Signed)
  [facility] :  Will you be returning to the same living situation after discharge:  No. Pt will temporarily return home and has screening [facility].  At discharge, do you have transportation home?: Yes,  [place] [facility] Do you have the ability to pay for your medications: Yes,  mental health  Release of information consent forms completed and submitted to medical records by CSW.  Patient to Follow up at: Follow-up Information    [facility] on [facility].   Why:  Admission for treatment on Wed, 02/14/18. Please bring 30 day supply of all medications. Thank you.  Contact information: [address] [place], [place] [address] Phone: [phone] Fax: [phone]          Next level of care provider has access to [facility] Link:no  Safety Planning and Suicide Prevention discussed: Yes,  SPE completed with pt; contact attempts with pt's mother. SPI pamphlet and Mobile Crisis information provided to pt.   Have you used any form of tobacco in the last 30 days? (Cigarettes, Smokeless Tobacco, Cigars, and/or Pipes): No  Has patient been referred to the Quitline?: N/A patient is not a smoker  Patient has been referred for addiction treatment: Yes  Pulte HomesHeather N Smart, LCSW 02/09/2018, 10:52 AM

## 2018-02-09 NOTE — Progress Notes (Signed)
Recreation Therapy Notes  Date: 3.29.19 Time: 9:30 a.m. Location: 300 Hall Dayroom   Group Topic: Stress Management   Goal Area(s) Addresses:  Goal 1.1: To reduce stress  -Patient will report feeling a reduction in stress level  -Patient will identify the importance of stress management  -Patient will participate during stress management group treatment     Behavioral Response: Engaged   Intervention: Stress Management   Activity: Meditation- Patients were in a peaceful environment with soft lighting enhancing patients mood. Patients listened to a mindfulness meditation voiceover to help reduce stress levels   Education: Stress Management, Discharge Planning.    Education Outcome: Acknowledges edcuation/In group clarification offered/Needs additional education   Clinical Observations/Feedback:: Patient attended and participated appropriately during stress management group treatment. Patient reported feeling a reduction in stress level.    Sheryle Hailarian Dejah Droessler, Recreation Therapy Intern   Sheryle HailDarian Pricella Gaugh 02/09/2018 8:21 AM

## 2018-02-09 NOTE — Discharge Summary (Signed)
Physician Discharge Summary Note  Patient:  Stephanie Mercado is an 27 y.o., female MRN:  [id number] DOB:  1991/06/01 Patient phone:  [phone] (home)  Patient address:   [address] [place] [address],  Total Time spent with patient: 20 minutes  Date of Admission:  02/07/2018 Date of Discharge: 02/09/18  Reason for Admission:  Worsening depression with suicide attempt  Principal Problem: MDD (major depressive disorder), severe [facility]) Discharge Diagnoses: Patient Active Problem List   Diagnosis Date Noted  . MDD (major depressive disorder), severe (HCC) [F32.2] 02/08/2018  . Moderate alcohol use disorder (HCC) [F10.20]   . Intentional overdose of drug in tablet form (HCC) [T50.902A]   . Alcohol withdrawal delirium, acute, hyperactive (HCC) [F10.231] 02/05/2018  . Overdose [T50.901A] 02/05/2018  . Delirium [R41.0] 02/05/2018  . Insomnia [G47.00] 10/12/2015  . Skin lesion of back [L98.9] 07/25/2014  . ADD (attention deficit disorder) [F98.8] 03/31/2014  . Allergic rhinitis [J30.9] 03/31/2014  . Migraine headache with aura [G43.109] 02/15/2013    Past Psychiatric History: Patient has had at least 3 psychiatric hospitalizations.  One at [facility], one at [facility] for detox in December 2018, and 1 at [place] for detox.   Past Medical History:  Past Medical History:  Diagnosis Date  . Allergy   . Eczema   . Migraines     Past Surgical History:  Procedure Laterality Date  . TONSILLECTOMY    . WISDOM TOOTH EXTRACTION     Family History:  Family History  Problem Relation Age of Onset  . Cancer Mother   . Kidney disease Mother   . Diabetes Paternal Grandfather    Family Psychiatric  History: Mother has a history of alcohol use disorder, but has been clean for 6 months, father is a history of opiate disorder and has been clean for 8 months, mom also suffers anxiety and is being treated with Zoloft.  Social History:  Social History   Substance and  Sexual Activity  Alcohol Use No  . Frequency: Never   Comment: rehab     Social History   Substance and Sexual Activity  Drug Use No    Social History   Socioeconomic History  . Marital status: Single    Spouse name: Not on file  . Number of children: Not on file  . Years of education: Not on file  . Highest education level: Not on file  Occupational History  . Not on file  Social Needs  . Financial resource strain: Not on file  . Food insecurity:    Worry: Not on file    Inability: Not on file  . Transportation needs:    Medical: Not on file    Non-medical: Not on file  Tobacco Use  . Smoking status: Never Smoker  . Smokeless tobacco: Never Used  Substance and Sexual Activity  . Alcohol use: No    Frequency: Never    Comment: rehab  . Drug use: No  . Sexual activity: Yes    Birth control/protection: IUD  Lifestyle  . Physical activity:    Days per week: Not on file    Minutes per session: Not on file  . Stress: Not on file  Relationships  . Social connections:    Talks on phone: Not on file    Gets together: Not on file    Attends religious service: Not on file    Active member of club or organization: Not on file    Attends meetings  of clubs or organizations: Not on file    Relationship status: Not on file  Other Topics Concern  . Not on file  Social History Narrative  . Not on file    Hospital Course:   02/08/18 [facility] MD Assessment: Patient is a 27 year old female with a past psychiatric history significant for alcohol use disorder and major depression who was transferred from an outside hospital after an intentional overdose of Seroquel.  The patient was scheduled to be [facility] for substance abuse rehabilitation, and decided that she wanted to "get high 1 last time".  She took 2-3 Seroquel 200 mg tablets, and felt strange.  She decided take additional medication, and then began to hallucinate.  She called for help at that time.  She  was transferred to the outside hospital.  She stated that she was not trying to kill herself, but that the hospital felt as though she was in alcohol withdrawal at that time.  She denied any recent alcohol.  She stated that she had only drank 5 times since her hospitalization in February at [place].  She was detoxed for the second time within 6 months at their facility.  After her agitation and hallucinations had resolved she was transferred to our facility for evaluation and stabilization.  She stated she was just trying to get high and was not trying to kill herself.  She stated she only had 5 drinks of alcohol since the discharge from [facility] on 2/8.  She was scheduled to be admitted to [facility] yesterday, but that is been moved back to next week.  She stated she was only taking the Seroquel for sleep, and was on no other medications.  She denied any suicidal, homicidal ideation.  She denied any other substance abuse.  She stated the last marijuana she had was in December.  She stated prior to becoming sober she would drink 6-7 cans of Mike's hard lemonade daily.  She was admitted to our facility for evaluation and stabilization. She had been previously admitted to our facility last year, and had been at [facility] for detox in December 2018, and then after she presented to a court hearing on February 6 or 7 she was admitted to [facility].  She was swabbed for alcohol at the court room, and found that she had been drinking.  Patient remained on the [facility] unit for 2 days and stabilized with therapy and medication. Patient was discharged on Zoloft 25 mg Daily, Vistaril 25 mg Q6H PRN, and Trazodone 50 mg QHS PRN. Patient showed improvement with improved mood, affect, sleep, appetite, and interaction. Patient has been seen in the day room with peers and staff interacting appropriately. Patient has been attending group and participating. Patient denies any SI/HI/AVH and contracts for safety.  Patient agrees to  follow up at [facility] on April 3rd. Patient is provided with 30 day samples and precriptions for her medications upon discharge. Patient was provided [place] transportation back to [facility].    Physical Findings: AIMS: Facial and Oral Movements Muscles of Facial Expression: None, normal Lips and Perioral Area: None, normal Jaw: None, normal Tongue: None, normal,Extremity Movements Upper (arms, wrists, hands, fingers): None, normal Lower (legs, knees, ankles, toes): None, normal, Trunk Movements Neck, shoulders, hips: None, normal, Overall Severity Severity of abnormal movements (highest score from questions above): None, normal Incapacitation due to abnormal movements: None, normal Patient's awareness of abnormal movements (rate only patient's report): No Awareness, Dental Status  Current problems with teeth and/or dentures?: No Does patient usually wear dentures?: No  CIWA:  CIWA-Ar Total: 2 COWS:  COWS Total Score: 2  Musculoskeletal: Strength & Muscle Tone: within normal limits Gait & Station: normal Patient leans: N/A  Psychiatric Specialty Exam: Physical Exam  Nursing note and vitals reviewed. Constitutional: She is oriented to person, place, and time. She appears well-developed and well-nourished.  Respiratory: Effort normal.  Musculoskeletal: Normal range of motion.  Neurological: She is alert and oriented to person, place, and time.  Skin: Skin is warm.    Review of Systems  Constitutional: Negative.   HENT: Negative.   Eyes: Negative.   Respiratory: Negative.   Cardiovascular: Negative.   Gastrointestinal: Negative.   Genitourinary: Negative.   Musculoskeletal: Negative.   Skin: Negative.   Neurological: Negative.   Endo/Heme/Allergies: Negative.   Psychiatric/Behavioral: Negative.     Blood pressure 119/81, pulse (!) 134, temperature 98.9 F (37.2 C), temperature source Oral, resp. rate 18, height 5\' 9"  (1.753 m), weight 81.2  kg (179 lb).Body mass index is 26.43 kg/m.  General Appearance: Casual  Eye Contact:  Good  Speech:  Clear and Coherent and Normal Rate  Volume:  Normal  Mood:  Euthymic  Affect:  Congruent  Thought Process:  Goal Directed and Descriptions of Associations: Intact  Orientation:  Full (Time, Place, and Person)  Thought Content:  WDL  Suicidal Thoughts:  No  Homicidal Thoughts:  No  Memory:  Immediate;   Good Recent;   Good Remote;   Good  Judgement:  Good  Insight:  Good  Psychomotor Activity:  Normal  Concentration:  Concentration: Good and Attention Span: Good  Recall:  Good  Fund of Knowledge:  Good  Language:  Good  Akathisia:  No  Handed:  Right  AIMS (if indicated):     Assets:  Communication Skills Desire for Improvement Financial Resources/Insurance Housing Physical Health Social Support Transportation  ADL's:  Intact  Cognition:  WNL  Sleep:  Number of Hours: 6     Have you used any form of tobacco in the last 30 days? (Cigarettes, Smokeless Tobacco, Cigars, and/or Pipes): No  Has this patient used any form of tobacco in the last 30 days? (Cigarettes, Smokeless Tobacco, Cigars, and/or Pipes) Yes, No  Blood Alcohol level:  Lab Results  Component Value Date   ETH <10 02/05/2018    Metabolic Disorder Labs:  No results found for: HGBA1C, MPG No results found for: PROLACTIN No results found for: CHOL, TRIG, HDL, CHOLHDL, VLDL, LDLCALC  See Psychiatric Specialty Exam and Suicide Risk Assessment completed by Attending Physician prior to discharge.  Discharge destination:  Home  Is patient on multiple antipsychotic therapies at discharge:  No   Has Patient had three or more failed trials of antipsychotic monotherapy by history:  No  Recommended Plan for Multiple Antipsychotic Therapies: NA   Allergies as of 02/09/2018      Reactions   Penicillins Hives   Has patient had a PCN reaction causing immediate rash, facial/tongue/throat swelling, SOB or  lightheadedness with hypotension: Yes Has patient had a PCN reaction causing severe rash involving mucus membranes or skin necrosis: No Has patient had a PCN reaction that required hospitalization No Has patient had a PCN reaction occurring within the last 10 years: No If all of the above answers are "NO", then may proceed with Cephalosporin use.   Rocephin [ceftriaxone] Other (See Comments)   Muscle cramps in back (lasted about 8 hrs)  Medication List    STOP taking these medications   chlordiazePOXIDE 25 MG capsule Commonly known as:  LIBRIUM   folic acid 1 MG tablet Commonly known as:  FOLVITE   thiamine 100 MG tablet     TAKE these medications     Indication  hydrOXYzine 25 MG tablet Commonly known as:  ATARAX/VISTARIL Take 1 tablet (25 mg total) by mouth every 6 (six) hours as needed for anxiety. 4 tabs daily as needed every 6 hours  Indication:  Feeling Anxious   KYLEENA 19.5 MG Iud Generic drug:  Levonorgestrel 1 each by Intrauterine route once.  Indication:  Birth Control Treatment   sertraline 25 MG tablet Commonly known as:  ZOLOFT Take 1 tablet (25 mg total) by mouth daily. 1 tab daily for mood control Start taking on:  02/10/2018  Indication:  mood stability      Follow-up Information    [facility] Follow up on 02/14/2018.   Why:  Admission for treatment on Wed, 02/14/18. Please bring 30 day supply of all medications. Thank you.  Contact information: [address] [place], [place] [address] Phone: [phone] Fax: [phone]          Follow-up recommendations:  Continue activity as tolerated. Continue diet as recommended by your PCP. Ensure to keep all appointments with outpatient providers.  Comments:  Patient is instructed prior to discharge to: Take all medications as prescribed by his/her mental healthcare provider. Report any adverse effects and or reactions from the medicines to his/her outpatient provider  promptly. Patient has been instructed & cautioned: To not engage in alcohol and or illegal drug use while on prescription medicines. In the event of worsening symptoms, patient is instructed to call the crisis hotline, 911 and or go to the nearest ED for appropriate evaluation and treatment of symptoms. To follow-up with his/her primary care provider for your other medical issues, concerns and or health care needs.    Signed: Gerlene Burdock Novah Nessel, FNP 02/09/2018, 1:18 PM

## 2018-02-09 NOTE — Progress Notes (Signed)
[facility] where she is going to meet her Parents.. Pt was stable and appreciative at that time. All papers, medication sample and prescriptions were given and valuables returned. Verbal understanding expressed. Denies SI/HI and A/VH. Pt given opportunity to express concerns and ask questions.

## 2018-02-09 NOTE — Tx Team (Addendum)
Interdisciplinary Treatment and Diagnostic Plan Update  02/09/2018 Time of Session: 0830AM Stephanie Mercado MRN: [id number]  Principal Diagnosis: MDD, recurrent, severe  Secondary Diagnoses: Active Problems:   MDD (major depressive disorder), severe (HCC)   Moderate alcohol use disorder (HCC)   Intentional overdose of drug in tablet form (HCC)   Current Medications:  Current Facility-Administered Medications  Medication Dose Route Frequency Provider Last Rate Last Dose  . acetaminophen (TYLENOL) tablet 650 mg  650 mg Oral Q6H PRN Rankin, Shuvon B, NP      . alum & mag hydroxide-simeth (MAALOX/MYLANTA) 200-200-20 MG/5ML suspension 30 mL  30 mL Oral Q4H PRN Rankin, Shuvon B, NP      . hydrOXYzine (ATARAX/VISTARIL) tablet 25 mg  25 mg Oral Q6H PRN Rankin, Shuvon B, NP   25 mg at 02/09/18 0826  . loperamide (IMODIUM) capsule 2-4 mg  2-4 mg Oral PRN Rankin, Shuvon B, NP      . LORazepam (ATIVAN) tablet 1 mg  1 mg Oral Q6H PRN Rankin, Shuvon B, NP   1 mg at 02/08/18 0018  . magnesium hydroxide (MILK OF MAGNESIA) suspension 30 mL  30 mL Oral Daily PRN Rankin, Shuvon B, NP      . multivitamin with minerals tablet 1 tablet  1 tablet Oral Daily Rankin, Shuvon B, NP   1 tablet at 02/09/18 0823  . ondansetron (ZOFRAN-ODT) disintegrating tablet 4 mg  4 mg Oral Q6H PRN Rankin, Shuvon B, NP      . sertraline (ZOLOFT) tablet 25 mg  25 mg Oral Daily Antonieta Pert, MD   25 mg at 02/09/18 4098  . thiamine (B-1) injection 100 mg  100 mg Intramuscular Once Rankin, Shuvon B, NP      . thiamine (VITAMIN B-1) tablet 100 mg  100 mg Oral Daily Rankin, Shuvon B, NP   100 mg at 02/09/18 0823  . traZODone (DESYREL) tablet 50 mg  50 mg Oral QHS PRN Antonieta Pert, MD   50 mg at 02/08/18 2302   PTA Medications: Medications Prior to Admission  Medication Sig Dispense Refill Last Dose  . chlordiazePOXIDE (LIBRIUM) 25 MG capsule Take 1 capsule (25 mg total) by mouth 3 (three) times daily for 2 days. 6 capsule 0    . folic acid (FOLVITE) 1 MG tablet Take 1 tablet (1 mg total) by mouth daily.     . Levonorgestrel (KYLEENA) 19.5 MG IUD 1 each by Intrauterine route once.   01/27/2017 at Unknown time  . thiamine 100 MG tablet Take 1 tablet (100 mg total) by mouth daily.       Patient Stressors: Legal issue Substance abuse  Patient Strengths: Average or above average intelligence General fund of knowledge Supportive family/friends  Treatment Modalities: Medication Management, Group therapy, Case management,  1 to 1 session with clinician, Psychoeducation, Recreational therapy.   Physician Treatment Plan for Primary Diagnosis: MDD, recurrent, severe Long Term Goal(s): Improvement in symptoms so as ready for discharge Improvement in symptoms so as ready for discharge   Short Term Goals: Ability to identify changes in lifestyle to reduce recurrence of condition will improve Ability to verbalize feelings will improve Ability to disclose and discuss suicidal ideas Ability to demonstrate self-control will improve Ability to identify and develop effective coping behaviors will improve Ability to maintain clinical measurements within normal limits will improve Compliance with prescribed medications will improve Ability to identify triggers associated with substance abuse/mental health issues will improve Ability to identify changes in lifestyle to  reduce recurrence of condition will improve Ability to verbalize feelings will improve Ability to disclose and discuss suicidal ideas Ability to demonstrate self-control will improve Ability to identify and develop effective coping behaviors will improve Ability to maintain clinical measurements within normal limits will improve Compliance with prescribed medications will improve Ability to identify triggers associated with substance abuse/mental health issues will improve  Medication Management: Evaluate patient's response, side effects, and tolerance of  medication regimen.  Therapeutic Interventions: 1 to 1 sessions, Unit Group sessions and Medication administration.  Evaluation of Outcomes: Progressing  Physician Treatment Plan for Secondary Diagnosis: Active Problems:   MDD (major depressive disorder), severe (HCC)   Moderate alcohol use disorder (HCC)   Intentional overdose of drug in tablet form (HCC)  Long Term Goal(s): Improvement in symptoms so as ready for discharge Improvement in symptoms so as ready for discharge   Short Term Goals: Ability to identify changes in lifestyle to reduce recurrence of condition will improve Ability to verbalize feelings will improve Ability to disclose and discuss suicidal ideas Ability to demonstrate self-control will improve Ability to identify and develop effective coping behaviors will improve Ability to maintain clinical measurements within normal limits will improve Compliance with prescribed medications will improve Ability to identify triggers associated with substance abuse/mental health issues will improve Ability to identify changes in lifestyle to reduce recurrence of condition will improve Ability to verbalize feelings will improve Ability to disclose and discuss suicidal ideas Ability to demonstrate self-control will improve Ability to identify and develop effective coping behaviors will improve Ability to maintain clinical measurements within normal limits will improve Compliance with prescribed medications will improve Ability to identify triggers associated with substance abuse/mental health issues will improve     Medication Management: Evaluate patient's response, side effects, and tolerance of medication regimen.  Therapeutic Interventions: 1 to 1 sessions, Unit Group sessions and Medication administration.  Evaluation of Outcomes: Progressing   RN Treatment Plan for Primary Diagnosis: MDD, recurrent, severe Long Term Goal(s): Knowledge of disease and therapeutic  regimen to maintain health will improve  Short Term Goals: Ability to remain free from injury will improve, Ability to verbalize frustration and anger appropriately will improve, Ability to disclose and discuss suicidal ideas and Ability to identify and develop effective coping behaviors will improve  Medication Management: RN will administer medications as ordered by provider, will assess and evaluate patient's response and provide education to patient for prescribed medication. RN will report any adverse and/or side effects to prescribing provider.  Therapeutic Interventions: 1 on 1 counseling sessions, Psychoeducation, Medication administration, Evaluate responses to treatment, Monitor vital signs and CBGs as ordered, Perform/monitor CIWA, COWS, AIMS and Fall Risk screenings as ordered, Perform wound care treatments as ordered.  Evaluation of Outcomes: Progressing   LCSW Treatment Plan for Primary Diagnosis: MDD, recurrent, severe Long Term Goal(s): Safe transition to appropriate next level of care at discharge, Engage patient in therapeutic group addressing interpersonal concerns.  Short Term Goals: Engage patient in aftercare planning with referrals and resources, Facilitate patient progression through stages of change regarding substance use diagnoses and concerns and Identify triggers associated with mental health/substance abuse issues  Therapeutic Interventions: Assess for all discharge needs, 1 to 1 time with Social worker, Explore available resources and support systems, Assess for adequacy in community support network, Educate family and significant other(s) on suicide prevention, Complete Psychosocial Assessment, Interpersonal group therapy.  Evaluation of Outcomes: Progressing   Progress in Treatment: Attending groups: Yes. Participating in groups: Yes. Taking medication  as prescribed: Yes. Toleration medication: Yes. Family/Significant other contact made: Contact attempts  made with pt's mother. SPE completed with pt. Collateral information needed from pt's mother prior to discharge.  Patient understands diagnosis: Yes. Discussing patient identified problems/goals with staff: Yes. Medical problems stabilized or resolved: Yes. Denies suicidal/homicidal ideation: Yes. Issues/concerns per patient self-inventory: No. Other: n/a   New problem(s) identified: No, Describe:  n/a  New Short Term/Long Term Goal(s): detox, medication management for mood stabilization; elimination of SI thoughts; development of comprehensive mental wellness/sobriety plan.   Patient Goal: "Learn coping skills to deal with the stressors in my life and identify triggers."   Discharge Plan or Barriers: CSW assessing-Pt has bed at [facility] scheduled for 02/14/18. Pt is hoping to return home with her parents for a few days before going to [facility]. [facility] pamphlet and AA/NA information provided to pt.   Reason for Continuation of Hospitalization: Anxiety Depression Medication stabilization  Estimated Length of Stay: Friday, 02/09/18  Attendees: Patient: Stephanie Mercado 02/09/2018 8:59 AM  Physician: Dr. Jola Babinski MD; Dr. Altamese [place] MD 02/09/2018 8:59 AM  Nursing: Marchelle Folks RN; Erskine Squibb RN; Patty RN 02/09/2018 8:59 AM  RN Care Manager:x 02/09/2018 8:59 AM  Social Worker: Chartered loss adjuster, LCSW 02/09/2018 8:59 AM  Recreational Therapist: x 02/09/2018 8:59 AM  Other: Armandina Stammer NP; Feliz Beam Money NP 02/09/2018 8:59 AM  Other:  02/09/2018 8:59 AM  Other: 02/09/2018 8:59 AM    Scribe for Treatment Team: Ledell Peoples Smart, LCSW 02/09/2018 8:59 AM

## 2018-02-09 NOTE — [facility] Suicide Risk Assessment (Signed)
[facility]   Principal Problem: <principal problem not specified> Discharge Diagnoses:  Patient Active Problem List   Diagnosis Date Noted  . MDD (major depressive disorder), severe (HCC) [F32.2] 02/08/2018  . Moderate alcohol use disorder (HCC) [F10.20]   . Intentional overdose of drug in tablet form (HCC) [T50.902A]   . Alcohol withdrawal delirium, acute, hyperactive (HCC) [F10.231] 02/05/2018  . Overdose [T50.901A] 02/05/2018  . Delirium [R41.0] 02/05/2018  . Insomnia [G47.00] 10/12/2015  . Skin lesion of back [L98.9] 07/25/2014  . ADD (attention deficit disorder) [F98.8] 03/31/2014  . Allergic rhinitis [J30.9] 03/31/2014  . Migraine headache with aura [G43.109] 02/15/2013    Total Time spent with patient: 30 minutes  Musculoskeletal: Strength & Muscle Tone: within normal limits Gait & Station: normal Patient leans: N/A  Psychiatric Specialty Exam: Review of Systems  All other systems reviewed and are negative.   Blood pressure 119/81, pulse (!) 134, temperature 98.9 F (37.2 C), temperature source Oral, resp. rate 18, height 5\' 9"  (1.753 m), weight 81.2 kg (179 lb).Body mass index is 26.43 kg/m.  General Appearance: Fairly Groomed  Patent attorneyye Contact::  Good  Speech:  Clear and Coherent409  Volume:  Normal  Mood:  Anxious  Affect:  Congruent  Thought Process:  Coherent  Orientation:  Full (Time, Place, and Person)  Thought Content:  Logical  Suicidal Thoughts:  No  Homicidal Thoughts:  No  Memory:  Immediate;   Fair  Judgement:  Intact  Insight:  Fair  Psychomotor Activity:  Increased  Concentration:  Good  Recall:  Good  Fund of Knowledge:Good  Language: Good  Akathisia:  No  Handed:  Right  AIMS (if indicated):     Assets:  Communication Skills Desire for Improvement Housing Resilience Social Support  Sleep:  Number of Hours: 6  Cognition: WNL  ADL's:  Intact   Mental Status Per Nursing Assessment::   On Admission:      Demographic Factors:  Adolescent or young adult, Caucasian and Low socioeconomic status  Loss Factors: NA  Historical Factors: Prior suicide attempts and Impulsivity  Risk Reduction Factors:   Sense of responsibility to family and Positive social support  Continued Clinical Symptoms:  Alcohol/Substance Abuse/Dependencies  Cognitive Features That Contribute To Risk:  None    Suicide Risk:  Minimal: No identifiable suicidal ideation.  Patients presenting with no risk factors but with morbid ruminations; may be classified as minimal risk based on the severity of the depressive symptoms    Plan Of Care/Follow-up recommendations:  Activity:  ad lib  Antonieta PertGreg Lawson Clary, MD 02/09/2018, 10:18 AM

## 2018-09-12 DIAGNOSIS — R748 Abnormal levels of other serum enzymes: Secondary | ICD-10-CM | POA: Insufficient documentation

## 2018-10-18 ENCOUNTER — Emergency Department (HOSPITAL_COMMUNITY): Payer: Self-pay

## 2018-10-18 ENCOUNTER — Encounter (HOSPITAL_COMMUNITY): Payer: Self-pay | Admitting: Emergency Medicine

## 2018-10-18 ENCOUNTER — Emergency Department (HOSPITAL_COMMUNITY)
Admission: EM | Admit: 2018-10-18 | Discharge: 2018-10-18 | Disposition: A | Discharge status: Home / Self Care | Payer: Self-pay | Attending: Emergency Medicine | Admitting: Emergency Medicine

## 2018-10-18 ENCOUNTER — Other Ambulatory Visit: Payer: Self-pay

## 2018-10-18 DIAGNOSIS — Y999 Unspecified external cause status: Secondary | ICD-10-CM | POA: Insufficient documentation

## 2018-10-18 DIAGNOSIS — M791 Myalgia, unspecified site: Secondary | ICD-10-CM | POA: Insufficient documentation

## 2018-10-18 DIAGNOSIS — W108XXA Fall (on) (from) other stairs and steps, initial encounter: Secondary | ICD-10-CM | POA: Insufficient documentation

## 2018-10-18 DIAGNOSIS — S060X0A Concussion without loss of consciousness, initial encounter: Secondary | ICD-10-CM | POA: Insufficient documentation

## 2018-10-18 DIAGNOSIS — Y929 Unspecified place or not applicable: Secondary | ICD-10-CM | POA: Insufficient documentation

## 2018-10-18 DIAGNOSIS — Y9389 Activity, other specified: Secondary | ICD-10-CM | POA: Insufficient documentation

## 2018-10-18 LAB — POC URINE PREG, ED: Preg Test, Ur: NEGATIVE

## 2018-10-18 MED ORDER — MELOXICAM 7.5 MG PO TABS: 7.5000 mg | ORAL_TABLET | Freq: Two times a day (BID) | ORAL | 0 refills | Status: AC | PRN

## 2018-10-18 NOTE — ED Provider Notes (Signed)
[facility]ERGENCY DEPARTMENT Provider Note   CSN: [id number] Arrival date & time: 10/18/18  1829     History   Chief Complaint Chief Complaint  Patient presents with  . Fall    HPI Stephanie Mercado is a 27 y.o. female.  HPI  The patient is a 27 year old female, she has a history of migraine headaches, history of major depression and a history of alcohol abuse.  The patient states that she was in her usual state of health yesterday when she accidentally fell down the stairs.  She reports that she was wearing a pair of pants that were very wide at the bottom, as she stepped forward her foot got caught in the opposite legs bell bottom and it caused her to trip and fall face first into the stairs.  She fell down approximately 11-14 stairs, states that she talked into a ball and rolled down the stairs.  She is complaining of back pain, chest pain, bilateral hip pain and a headache with some change in mental status today.  She reports that she just does not feel right in the head, she cannot tell me exactly why but states that it is persistent throughout the day.  She also has some neck pain.  She refused to come to the hospital last night at her father's request.  She does report that today she drank for the first time in over 3 months because of the pain.  He states that she had 3-16 ounce alcoholic drinks that were 8% alcohol.  Past Medical History:  Diagnosis Date  . Allergy   . Eczema   . Migraines     Patient Active Problem List   Diagnosis Date Noted  . MDD (major depressive disorder), severe (HCC) 02/08/2018  . Moderate alcohol use disorder (HCC)   . Intentional overdose of drug in tablet form (HCC)   . Alcohol withdrawal delirium, acute, hyperactive (HCC) 02/05/2018  . Overdose 02/05/2018  . Delirium 02/05/2018  . Insomnia 10/12/2015  . Skin lesion of back 07/25/2014  . ADD (attention deficit disorder) 03/31/2014  . Allergic rhinitis 03/31/2014  . Migraine headache with  aura 02/15/2013    Past Surgical History:  Procedure Laterality Date  . TONSILLECTOMY    . WISDOM TOOTH EXTRACTION       OB History   None      Home Medications    Prior to Admission medications   Medication Sig Start Date End Date Taking? Authorizing Provider  hydrOXYzine (ATARAX/VISTARIL) 25 MG tablet Take 1 tablet (25 mg total) by mouth every 6 (six) hours as needed for anxiety. 4 tabs daily as needed every 6 hours 02/09/18   Money, Gerlene Burdockravis B, FNP  Levonorgestrel (KYLEENA) 19.5 MG IUD 1 each by Intrauterine route once.    [provider]  meloxicam (MOBIC) 7.5 MG tablet Take 1 tablet (7.5 mg total) by mouth 2 (two) times daily as needed for up to 10 days for pain. 10/18/18 10/28/18  Eber HongMiller, Analaya Hoey, MD  sertraline (ZOLOFT) 25 MG tablet Take 1 tablet (25 mg total) by mouth daily. 1 tab daily for mood control 02/10/18   Money, Gerlene Burdockravis B, FNP    Family History Family History  Problem Relation Age of Onset  . Cancer Mother   . Kidney disease Mother   . Diabetes Paternal Grandfather     Social History Social History   Tobacco Use  . Smoking status: Never Smoker  . Smokeless tobacco: Never Used  Substance Use Topics  .  Alcohol use: Yes    Frequency: Never    Comment: occas  . Drug use: Yes    Types: Marijuana    Comment: a few days ago     Allergies   Penicillins and Rocephin [ceftriaxone]   Review of Systems Review of Systems  All other systems reviewed and are negative.    Physical Exam Updated Vital Signs BP (!) 151/98 (BP Location: Right Arm)   Pulse (!) 115   Temp 98.3 F (36.8 C) (Oral)   Resp 20   Ht 1.702 m (5\' 7" )   Wt 77.1 kg   SpO2 96%   BMI 26.63 kg/m   Physical Exam  Constitutional: She appears well-developed and well-nourished. No distress.  HENT:  Head: Normocephalic and atraumatic.  Mouth/Throat: Oropharynx is clear and moist. No oropharyngeal exudate.  No hematoma - she has no ttp over the face, nose or jaw - she has no ttp  over the scalp.  She has no malocclusion  Eyes: Pupils are equal, round, and reactive to light. Conjunctivae and EOM are normal. Right eye exhibits no discharge. Left eye exhibits no discharge. No scleral icterus.  Neck: No JVD present. No thyromegaly present.  Cardiovascular: Normal rate, regular rhythm, normal heart sounds and intact distal pulses. Exam reveals no gallop and no friction rub.  No murmur heard. Pulmonary/Chest: Effort normal and breath sounds normal. No respiratory distress. She has no wheezes. She has no rales. She exhibits no tenderness.  No bruising over the chest wall  Abdominal: Soft. Bowel sounds are normal. She exhibits no distension and no mass. There is no tenderness.  No bruising over the abdominal wall  Musculoskeletal: Normal range of motion. She exhibits tenderness. She exhibits no edema.  There is tenderness over the posterior spine of the cervical thoracic and lumbar spines though the patient does not seem to guard or withdraw from exam.  She is able to stand up in the upright position without any difficulty with her gait or position, all 4 extremities have full range of motion at the joints with no tenderness, compartments are all very soft, no signs of bruising  Lymphadenopathy:    She has no cervical adenopathy.  Neurological: She is alert. Coordination normal.  The patient speech is clear, she is able to follow commands, she is able to give me a history he does do some repetitive history  Skin: Skin is warm and dry. No rash noted. No erythema.  Psychiatric: She has a normal mood and affect. Her behavior is normal.  Nursing note and vitals reviewed.    ED Treatments / Results  Labs (all labs ordered are listed, but only abnormal results are displayed) Labs Reviewed  POC URINE PREG, ED    EKG None  Radiology Dg Chest 2 View  Result Date: 10/18/2018 CLINICAL DATA:  Patient fell last evening down several steps. Pain. EXAM: CHEST - 2 VIEW COMPARISON:   01/27/2017 FINDINGS: The heart size and mediastinal contours are within normal limits. Both lungs are clear. Mild gentle dextroconvex curvature of the lower thoracic spine. No acute displaced rib fracture. IMPRESSION: 1. No active cardiopulmonary disease. 2. No acute osseous abnormality. Electronically Signed   By: Tollie Eth M.D.   On: 10/18/2018 21:27   Dg Thoracic Spine 4v  Result Date: 10/18/2018 CLINICAL DATA:  Pain after fall last evening. EXAM: THORACIC SPINE - 4+ VIEW COMPARISON:  CXR 01/27/2017 FINDINGS: Mild dextroconvex curvature of the lower thoracic spine, apex at T10. Disc spaces are maintained  without significant flattening. No acute fracture or suspicious osseous lesions. IMPRESSION: Mild dextroconvex curvature of the lower thoracic spine. No acute osseous abnormality. Electronically Signed   By: Tollie Eth M.D.   On: 10/18/2018 21:21   Dg Lumbar Spine Complete  Result Date: 10/18/2018 CLINICAL DATA:  Neck pain after trauma EXAM: LUMBAR SPINE - COMPLETE 4+ VIEW COMPARISON:  07/14/2014 FINDINGS: There is no evidence of lumbar spine fracture. Gentle levoconvex curvature of the lumbar spine is stable in appearance relative to 2015 comparison. There are 5 non ribbed lumbar type vertebrae. Slight disc space narrowing is noted at all levels of the lumbar spine. No pars defects or listhesis. No suspicious osseous lesions. Intrauterine device projects over the midline lower pelvis. IMPRESSION: No acute osseous abnormality of the lumbar spine. Electronically Signed   By: Tollie Eth M.D.   On: 10/18/2018 21:23   Dg Pelvis 1-2 Views  Result Date: 10/18/2018 CLINICAL DATA:  Patient fell last evening with pain all over. EXAM: PELVIS - 1-2 VIEW COMPARISON:  None. FINDINGS: There is no evidence of pelvic fracture or diastasis. Both hip joints are maintained without proximal femoral fracture. Numerous phleboliths are present within the pelvis bilaterally. An IUD is in place within the mid pelvis. No  pelvic bone lesions are seen. IMPRESSION: No acute pelvic or hip fracture. Electronically Signed   By: Tollie Eth M.D.   On: 10/18/2018 21:25   Ct Head Wo Contrast  Result Date: 10/18/2018 CLINICAL DATA:  Larey Seat down stairs. EXAM: CT HEAD WITHOUT CONTRAST CT CERVICAL SPINE WITHOUT CONTRAST TECHNIQUE: Multidetector CT imaging of the head and cervical spine was performed following the standard protocol without intravenous contrast. Multiplanar CT image reconstructions of the cervical spine were also generated. COMPARISON:  None. FINDINGS: CT HEAD FINDINGS Brain: No acute intracranial abnormality. Specifically, no hemorrhage, hydrocephalus, mass lesion, acute infarction, or significant intracranial injury. Vascular: No hyperdense vessel or unexpected calcification. Skull: No acute calvarial abnormality. Sinuses/Orbits: Visualized paranasal sinuses and mastoids clear. Orbital soft tissues unremarkable. Other: None CT CERVICAL SPINE FINDINGS Alignment: Normal Skull base and vertebrae: No acute fracture. No primary bone lesion or focal pathologic process. Soft tissues and spinal canal: No prevertebral fluid or swelling. No visible canal hematoma. Disc levels:  Maintained Upper chest: Negative Other: None IMPRESSION: No intracranial abnormality. No acute bony abnormality in the cervical spine. Electronically Signed   By: Charlett Nose M.D.   On: 10/18/2018 21:03   Ct Cervical Spine Wo Contrast  Result Date: 10/18/2018 CLINICAL DATA:  Larey Seat down stairs. EXAM: CT HEAD WITHOUT CONTRAST CT CERVICAL SPINE WITHOUT CONTRAST TECHNIQUE: Multidetector CT imaging of the head and cervical spine was performed following the standard protocol without intravenous contrast. Multiplanar CT image reconstructions of the cervical spine were also generated. COMPARISON:  None. FINDINGS: CT HEAD FINDINGS Brain: No acute intracranial abnormality. Specifically, no hemorrhage, hydrocephalus, mass lesion, acute infarction, or significant  intracranial injury. Vascular: No hyperdense vessel or unexpected calcification. Skull: No acute calvarial abnormality. Sinuses/Orbits: Visualized paranasal sinuses and mastoids clear. Orbital soft tissues unremarkable. Other: None CT CERVICAL SPINE FINDINGS Alignment: Normal Skull base and vertebrae: No acute fracture. No primary bone lesion or focal pathologic process. Soft tissues and spinal canal: No prevertebral fluid or swelling. No visible canal hematoma. Disc levels:  Maintained Upper chest: Negative Other: None IMPRESSION: No intracranial abnormality. No acute bony abnormality in the cervical spine. Electronically Signed   By: Charlett Nose M.D.   On: 10/18/2018 21:03    Procedures Procedures (including  critical care time)  Medications Ordered in ED Medications - No data to display   Initial Impression / Assessment and Plan / ED Course  I have reviewed the triage vital signs and the nursing notes.  Pertinent labs & imaging results that were available during my care of the patient were reviewed by me and considered in my medical decision making (see chart for details).  Clinical Course as of Oct 18 2138  Thu Oct 18, 2018  2137 Patient was updated on her results, she is agreeable to discharge, anti-inflammatory for pain, concussion precautions given   [BM]    Clinical Course User Index [BM] Eber Hong, MD    The patient has had a fall with a suppose it head injury yesterday though at this time I do not see anything on exam that raises concern for severe trauma.  Given her repetitive questioning and complains of both spinal and neck pain she will undergo some imaging to further evaluate for internal injuries but without any signs of blunt trauma to the abdomen or chest, without any tenderness on exam and with normal lung sounds I do not think that she needs to have CT scans of the structures.  agreeable to the plan  Final Clinical Impressions(s) / ED Diagnoses   Final diagnoses:    Concussion without loss of consciousness, initial encounter  Myalgia    ED Discharge Orders         Ordered    meloxicam (MOBIC) 7.5 MG tablet  2 times daily PRN     10/18/18 2138           Eber Hong, MD 10/18/18 2139

## 2018-10-18 NOTE — ED Triage Notes (Signed)
Patient tripped over her pants and fell down 14 steps. Patient reports LOC, hit her head several times.

## 2018-10-18 NOTE — ED Triage Notes (Signed)
Patient fell last night. Father reports patient is not thinking clearly, has been confused.

## 2018-10-18 NOTE — Discharge Instructions (Signed)
Your x-rays show no signs of broken bones, you have likely had a concussion, please read the attached instructions, Mobic or ibuprofen for pain, you may add melatonin or Unisom to help sleep at night

## 2020-11-05 IMAGING — DX DG LUMBAR SPINE COMPLETE 4+V
5 series · 5 of 5 positions shown · non-contrast
Comparison: 07/14/2014

CLINICAL DATA: Neck pain after trauma

EXAM:
LUMBAR SPINE - COMPLETE 4+ VIEW

[l-spine ap]
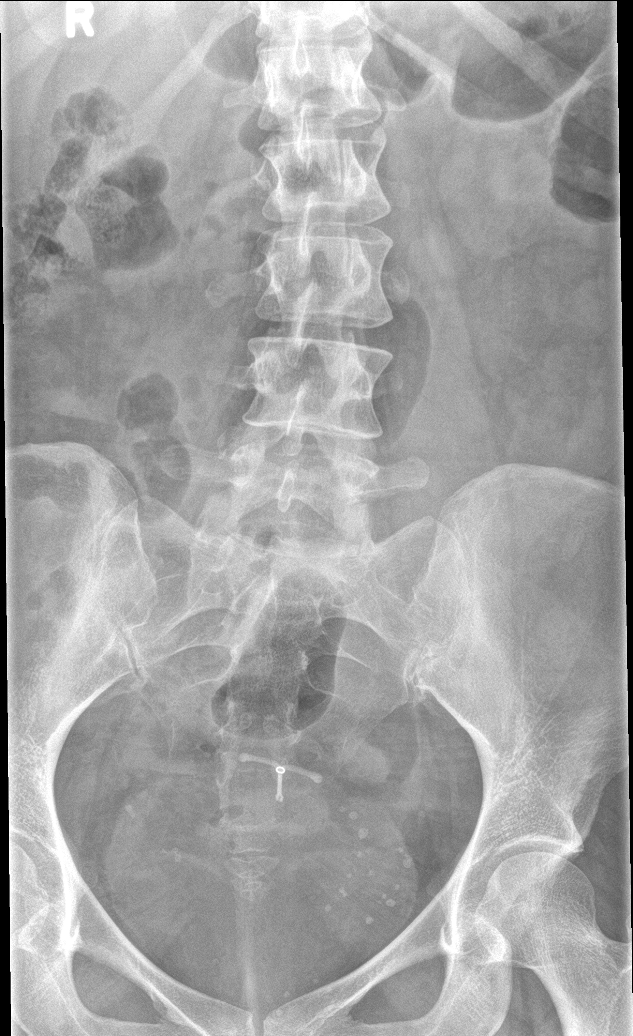

[l-spine obl (1 of 2)]
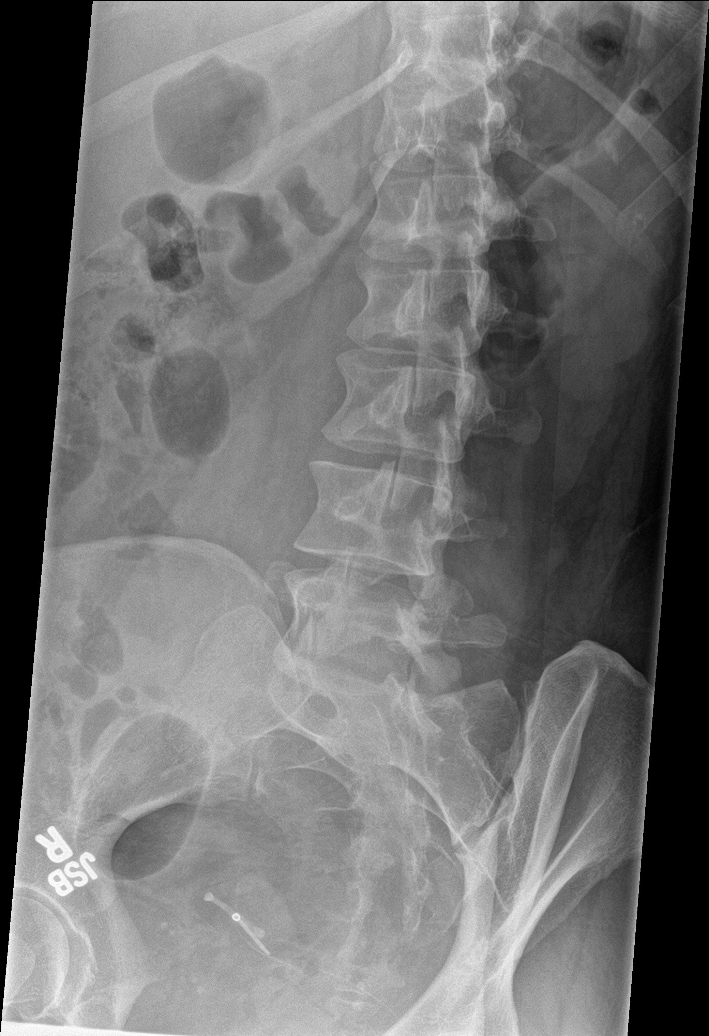

[l-spine obl (2 of 2)]
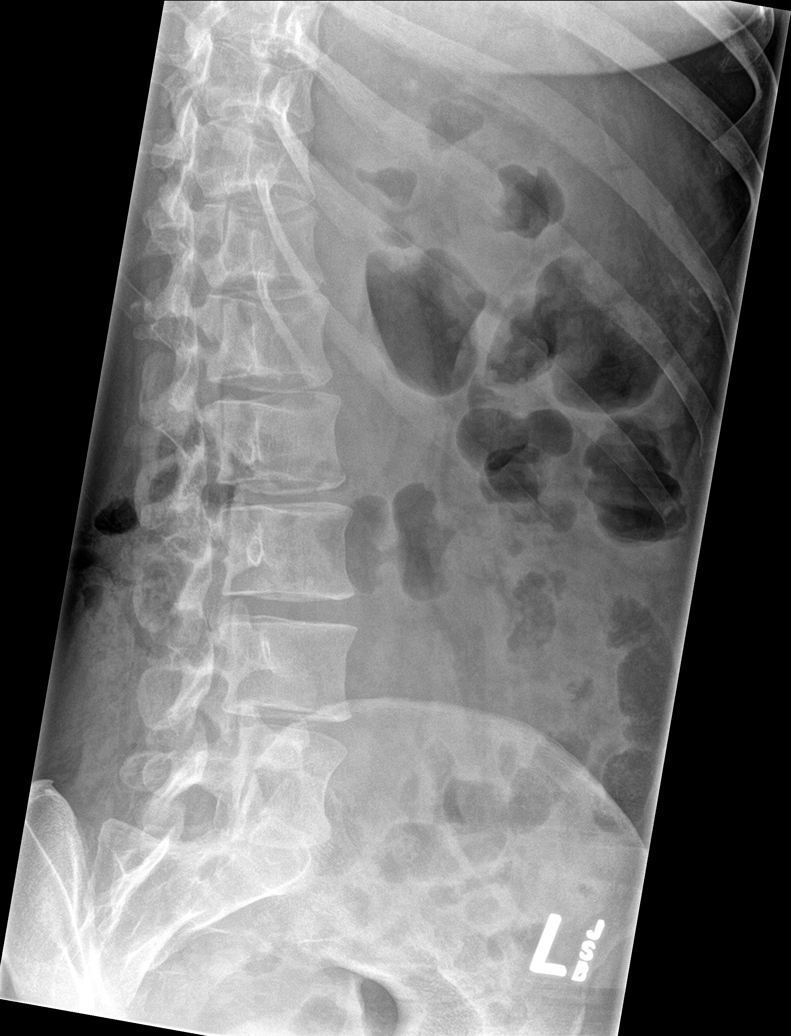

[l-spine lat]
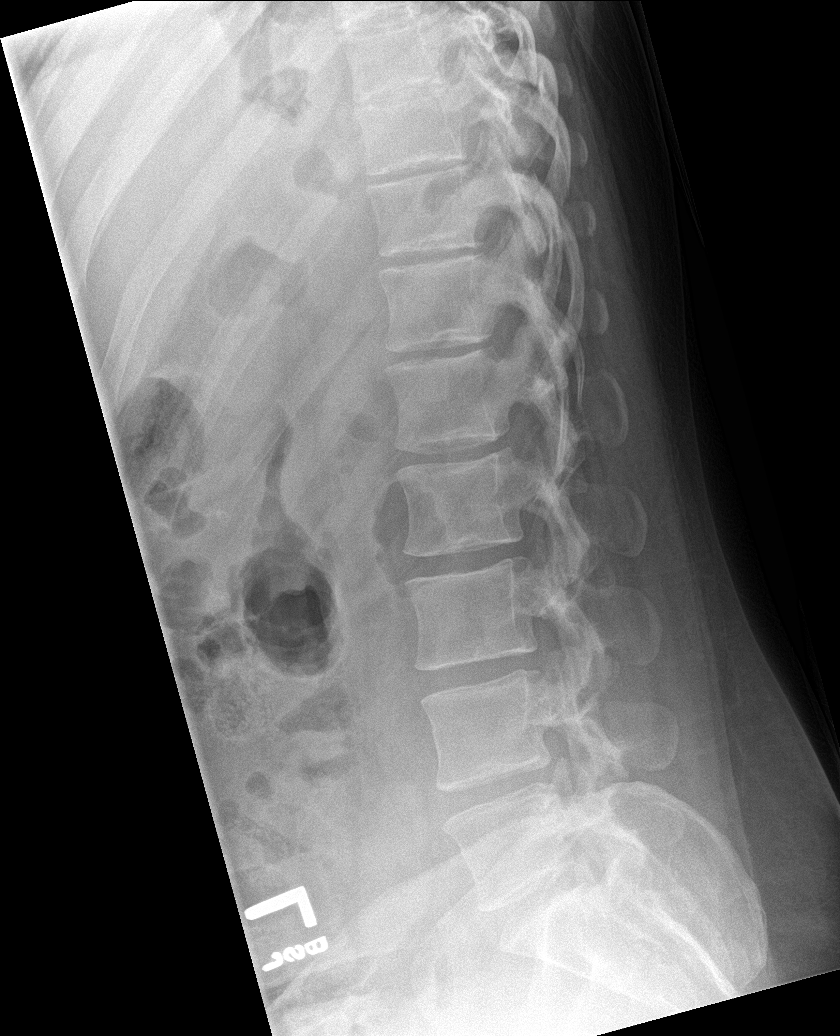

[l-spine spot]
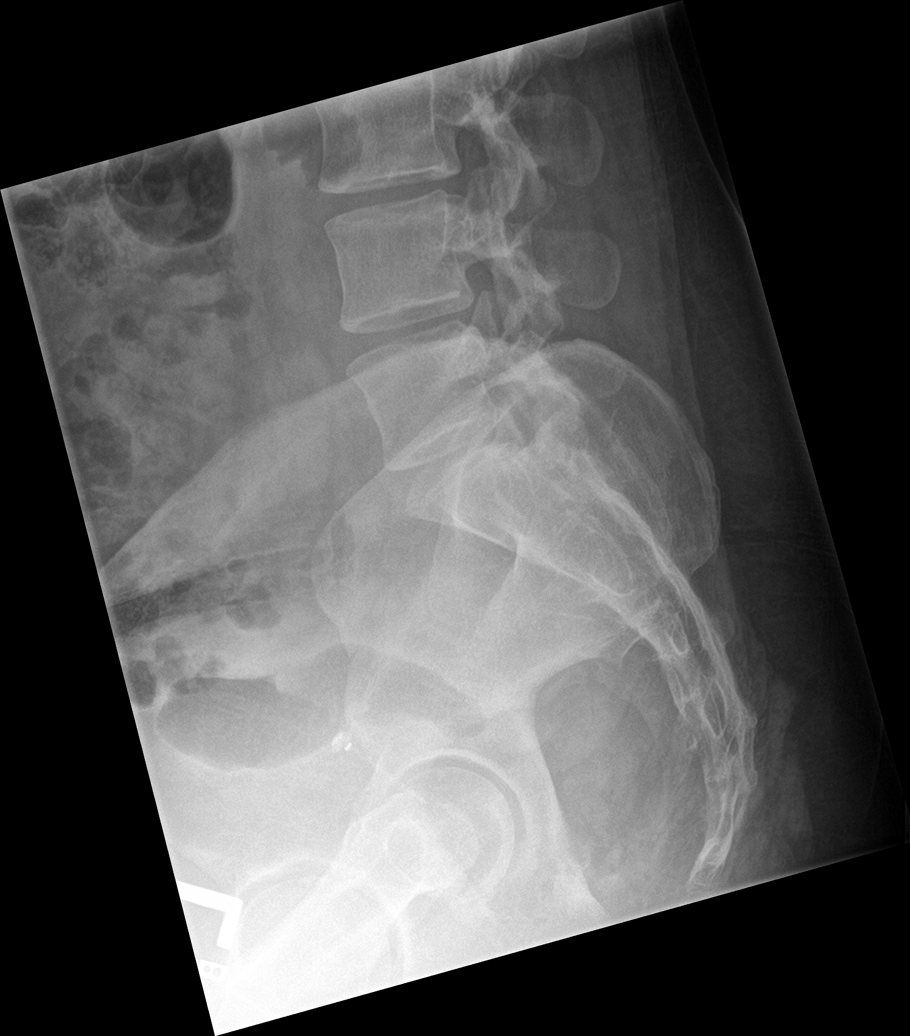

[5 of 5 positions shown; findings below may reference images not displayed]

FINDINGS: There is no evidence of lumbar spine fracture. Gentle levoconvex
curvature of the lumbar spine is stable in appearance relative to
6084 comparison. There are 5 non ribbed lumbar type vertebrae.
Slight disc space narrowing is noted at all levels of the lumbar
spine. No pars defects or listhesis. No suspicious osseous lesions.
Intrauterine device projects over the midline lower pelvis.
IMPRESSION: No acute osseous abnormality of the lumbar spine.

## 2022-01-31 DIAGNOSIS — F1410 Cocaine abuse, uncomplicated: Secondary | ICD-10-CM | POA: Insufficient documentation

## 2022-01-31 DIAGNOSIS — Z79899 Other long term (current) drug therapy: Secondary | ICD-10-CM | POA: Insufficient documentation

## 2022-01-31 DIAGNOSIS — R7401 Elevation of levels of liver transaminase levels: Secondary | ICD-10-CM | POA: Insufficient documentation

## 2022-01-31 DIAGNOSIS — F4312 Post-traumatic stress disorder, chronic: Secondary | ICD-10-CM | POA: Insufficient documentation

## 2022-01-31 DIAGNOSIS — R748 Abnormal levels of other serum enzymes: Secondary | ICD-10-CM | POA: Insufficient documentation

## 2023-04-11 ENCOUNTER — Emergency Department (HOSPITAL_BASED_OUTPATIENT_CLINIC_OR_DEPARTMENT_OTHER): Payer: Self-pay

## 2023-04-11 ENCOUNTER — Emergency Department (HOSPITAL_COMMUNITY)
Admission: EM | Admit: 2023-04-11 | Discharge: 2023-04-11 | Discharge status: Against Medical Advice | Payer: Self-pay | Source: Home / Self Care

## 2023-04-11 ENCOUNTER — Other Ambulatory Visit: Payer: Self-pay

## 2023-04-11 ENCOUNTER — Encounter (HOSPITAL_BASED_OUTPATIENT_CLINIC_OR_DEPARTMENT_OTHER): Payer: Self-pay

## 2023-04-11 ENCOUNTER — Observation Stay (HOSPITAL_BASED_OUTPATIENT_CLINIC_OR_DEPARTMENT_OTHER)
Admission: EM | Admit: 2023-04-11 | Discharge: 2023-04-12 | Disposition: A | Discharge status: Home / Self Care | Payer: Self-pay | Attending: Internal Medicine | Admitting: Internal Medicine

## 2023-04-11 DIAGNOSIS — F988 Other specified behavioral and emotional disorders with onset usually occurring in childhood and adolescence: Secondary | ICD-10-CM | POA: Diagnosis present

## 2023-04-11 DIAGNOSIS — A419 Sepsis, unspecified organism: Principal | ICD-10-CM | POA: Insufficient documentation

## 2023-04-11 DIAGNOSIS — F1090 Alcohol use, unspecified, uncomplicated: Secondary | ICD-10-CM

## 2023-04-11 DIAGNOSIS — Z79899 Other long term (current) drug therapy: Secondary | ICD-10-CM | POA: Insufficient documentation

## 2023-04-11 DIAGNOSIS — E876 Hypokalemia: Secondary | ICD-10-CM | POA: Insufficient documentation

## 2023-04-11 DIAGNOSIS — N200 Calculus of kidney: Secondary | ICD-10-CM | POA: Insufficient documentation

## 2023-04-11 DIAGNOSIS — F329 Major depressive disorder, single episode, unspecified: Secondary | ICD-10-CM | POA: Insufficient documentation

## 2023-04-11 DIAGNOSIS — N201 Calculus of ureter: Secondary | ICD-10-CM | POA: Diagnosis present

## 2023-04-11 DIAGNOSIS — N390 Urinary tract infection, site not specified: Secondary | ICD-10-CM | POA: Insufficient documentation

## 2023-04-11 LAB — CBC WITH DIFFERENTIAL/PLATELET
Abs Immature Granulocytes: 0.07 10*3/uL (ref 0.00–0.07)
Basophils Absolute: 0 10*3/uL (ref 0.0–0.1)
Basophils Relative: 0 %
Eosinophils Absolute: 0.1 10*3/uL (ref 0.0–0.5)
Eosinophils Relative: 1 %
HCT: 40.1 % (ref 36.0–46.0)
Hemoglobin: 14.4 g/dL (ref 12.0–15.0)
Immature Granulocytes: 1 %
Lymphocytes Relative: 7 %
Lymphs Abs: 1 10*3/uL (ref 0.7–4.0)
MCH: 32.4 pg (ref 26.0–34.0)
MCHC: 35.9 g/dL (ref 30.0–36.0)
MCV: 90.3 fL (ref 80.0–100.0)
Monocytes Absolute: 0.8 10*3/uL (ref 0.1–1.0)
Monocytes Relative: 5 %
Neutro Abs: 12 10*3/uL — ABNORMAL HIGH (ref 1.7–7.7)
Neutrophils Relative %: 86 %
Platelets: 149 10*3/uL — ABNORMAL LOW (ref 150–400)
RBC: 4.44 MIL/uL (ref 3.87–5.11)
RDW: 12.3 % (ref 11.5–15.5)
WBC: 14.1 10*3/uL — ABNORMAL HIGH (ref 4.0–10.5)
nRBC: 0 % (ref 0.0–0.2)

## 2023-04-11 LAB — URINALYSIS, W/ REFLEX TO CULTURE (INFECTION SUSPECTED)
Bilirubin Urine: NEGATIVE
Glucose, UA: NEGATIVE mg/dL
Ketones, ur: NEGATIVE mg/dL
Nitrite: POSITIVE — AB
Protein, ur: NEGATIVE mg/dL
Specific Gravity, Urine: 1.01 (ref 1.005–1.030)
WBC, UA: >50 WBC/hpf (ref 0–5)
pH: 6.5 (ref 5.0–8.0)

## 2023-04-11 LAB — BASIC METABOLIC PANEL
Anion gap: 13 (ref 5–15)
BUN: 13 mg/dL (ref 6–20)
CO2: 22 mmol/L (ref 22–32)
Calcium: 9.8 mg/dL (ref 8.9–10.3)
Chloride: 101 mmol/L (ref 98–111)
Creatinine, Ser: 0.79 mg/dL (ref 0.44–1.00)
GFR, Estimated: >60 mL/min (ref >60)
Glucose, Bld: 223 mg/dL — ABNORMAL HIGH (ref 70–99)
Potassium: 3.7 mmol/L (ref 3.5–5.1)
Sodium: 136 mmol/L (ref 135–145)

## 2023-04-11 LAB — PREGNANCY, URINE: Preg Test, Ur: NEGATIVE

## 2023-04-11 MED ORDER — HYDROMORPHONE HCL 1 MG/ML IJ SOLN
0.5000 mg | Freq: Once | INTRAMUSCULAR | Status: AC
  Administered 2023-04-11: 0.5 mg via INTRAVENOUS
  Filled 2023-04-11: qty 1

## 2023-04-11 MED ORDER — CIPROFLOXACIN IN D5W 400 MG/200ML IV SOLN
400.0000 mg | Freq: Once | INTRAVENOUS | Status: AC
  Administered 2023-04-11: 400 mg via INTRAVENOUS
  Filled 2023-04-11: qty 200

## 2023-04-11 MED ORDER — KETOROLAC TROMETHAMINE 30 MG/ML IJ SOLN
15.0000 mg | Freq: Once | INTRAMUSCULAR | Status: AC
  Administered 2023-04-11: 15 mg via INTRAVENOUS
  Filled 2023-04-11: qty 1

## 2023-04-11 NOTE — ED Provider Notes (Signed)
[place] EMERGENCY DE[facility]MENT AT [facility] Provider Note   CSN: [id number] Arrival date & time: 04/11/23  1829     History  Chief Complaint  Patient presents with   Flank Pain    Stephanie Mercado is a 32 y.o. female.   Flank Pain  Patient presents with left flank pain.  Began around 6 hours ago.  States left flank goes down to left groin.  Has had some dysuria.  States has had some chills.  No frank fevers.  States pain is severe.  Does not think she is pregnant.  Does have family history of kidney stones and reviewed previous CT scan that did show renal stones previously.    Past Medical History:  Diagnosis Date   Allergy    Eczema    Migraines     Home Medications Prior to Admission medications   Medication Sig Start Date End Date Taking? Authorizing Provider  hydrOXYzine (ATARAX/VISTARIL) 25 MG tablet Take 1 tablet (25 mg total) by mouth every 6 (six) hours as needed for anxiety. 4 tabs daily as needed every 6 hours 02/09/18   Money, Gerlene Burdock, FNP  Levonorgestrel (KYLEENA) 19.5 MG IUD 1 each by Intrauterine route once.    [provider]  sertraline (ZOLOFT) 25 MG tablet Take 1 tablet (25 mg total) by mouth daily. 1 tab daily for mood control 02/10/18   Money, Gerlene Burdock, FNP      Allergies    Penicillins and Rocephin [ceftriaxone]    Review of Systems   Review of Systems  Genitourinary:  Positive for flank pain.    Physical Exam Updated Vital Signs BP (!) 152/91 (BP Location: Right Arm)   Pulse (!) 110   Temp 98.9 F (37.2 C)   Resp 16   Ht 5\' 7"  (1.702 m)   Wt 86.2 kg   SpO2 97%   BMI 29.76 kg/m  Physical Exam Vitals and nursing note reviewed.  Cardiovascular:     Rate and Rhythm: Tachycardia present.  Abdominal:     Tenderness: There is abdominal tenderness.     Comments: CVA tenderness on left along with left lower quadrant tenderness.  Musculoskeletal:     Cervical back: Neck supple.  Skin:    General: Skin is warm.   Neurological:     Mental Status: She is alert and oriented to person, place, and time.     ED Results / Procedures / Treatments   Labs (all labs ordered are listed, but only abnormal results are displayed) Labs Reviewed  URINALYSIS, W/ REFLEX TO CULTURE (INFECTION SUSPECTED) - Abnormal; Notable for the following components:      Result Value   APPearance HAZY (*)    Hgb urine dipstick SMALL (*)    Nitrite POSITIVE (*)    Leukocytes,Ua LARGE (*)    Bacteria, UA FEW (*)    All other components within normal limits  BASIC METABOLIC PANEL - Abnormal; Notable for the following components:   Glucose, Bld 223 (*)    All other components within normal limits  CBC WITH DIFFERENTIAL/PLATELET - Abnormal; Notable for the following components:   WBC 14.1 (*)    Platelets 149 (*)    Neutro Abs 12.0 (*)    All other components within normal limits  URINE CULTURE  PREGNANCY, URINE    EKG None  Radiology CT Renal Stone Study  Result Date: 04/11/2023 CLINICAL DATA:  Abdominal/flank pain, stone suspected EXAM: CT ABDOMEN AND PELVIS WITHOUT CONTRAST TECHNIQUE: Multidetector CT  imaging of the abdomen and pelvis was performed following the standard protocol without IV contrast. RADIATION DOSE REDUCTION: This exam was performed according to the departmental dose-optimization program which includes automated exposure control, adjustment of the mA and/or kV according to patient size and/or use of iterative reconstruction technique. COMPARISON:  CT abdomen pelvis 07/12/2014 FINDINGS: Lower chest: No acute abnormality. Hepatobiliary: No focal liver abnormality. No gallstones, gallbladder wall thickening, or pericholecystic fluid. No biliary dilatation. Pancreas: No focal lesion. Normal pancreatic contour. No surrounding inflammatory changes. No main pancreatic ductal dilatation. Spleen: Normal in size without focal abnormality. Adrenals/Urinary Tract: No adrenal nodule bilaterally. No nephrolithiasis and  no hydronephrosis. Severe right hydronephrosis with change in caliber at the ureteropelvic junction. No right hydroureter. No nephroureterolithiasis. No left hydronephrosis or ureterolithiasis. The urinary bladder is unremarkable. Stomach/Bowel: Stomach is within normal limits. No evidence of bowel wall thickening or dilatation. Appendix appears normal. Vascular/Lymphatic: No abdominal aorta or iliac aneurysm. Mild atherosclerotic plaque of the aorta and its branches. No abdominal, pelvic, or inguinal lymphadenopathy. Reproductive: Uterus and bilateral adnexa are unremarkable. Other: No intraperitoneal free fluid. No intraperitoneal free gas. No organized fluid collection. Musculoskeletal: No abdominal wall hernia or abnormality. No suspicious lytic or blastic osseous lesions. No acute displaced fracture. IMPRESSION: Severe right hydronephrosis with change in caliber at the ureteropelvic junction. Limited evaluation on this noncontrast study. This may reflect a recently passed stone or non-radioopaque stone versus chronic changes of obstructive uropathy or reflux. Electronically Signed   By: Tish Frederickson M.D.   On: 04/11/2023 20:07    Procedures Procedures    Medications Ordered in ED Medications  ketorolac (TORADOL) 30 MG/ML injection 15 mg (15 mg Intravenous Given 04/11/23 1901)  ciprofloxacin (CIPRO) IVPB 400 mg (0 mg Intravenous Stopped 04/11/23 2054)  HYDROmorphone (DILAUDID) injection 0.5 mg (0.5 mg Intravenous Given 04/11/23 2045)  HYDROmorphone (DILAUDID) injection 0.5 mg (0.5 mg Intravenous Given 04/11/23 2141)    ED Course/ Medical Decision Making/ A&P                             Medical Decision Making Amount and/or Complexity of Data Reviewed Labs: ordered. Radiology: ordered.  Risk Prescription drug management. Decision regarding hospitalization.   Patient with left lower quadrant and left flank pain.  Began acutely.  Urine is cloudy.  Also having dysuria.  Differential bias  includes UTI, kidney stone or even combination of the 2.  Will get basic blood work and urinalysis.  White count elevated at 14.  Urinalysis shows likely UTI with greater than 50 white cells only 6-10 red cells and positive nitrite and large leukocytes.  Does have few bacteria.  With flank pain acute onset will get CT scan to evaluate for stone.  Will start with antibiotics.  Has Rocephin allergy  CT scan read as hydronephrosis on right with uteropelvic obstruction.  Discussed with Dr. Alvester Morin from urology.  He feels there may be stone on the left side at the UVJ about to pass.  Recommends admission to the hospital to medicine for antibiotics and IV fluids.  Consult him if worsens tonight.  Keep NPO.        Final Clinical Impression(s) / ED Diagnoses Final diagnoses:  Urinary tract infection without hematuria, site unspecified  Kidney stone    Rx / DC Orders ED Discharge Orders     None         Benjiman Core, MD 04/11/23 [phone]

## 2023-04-11 NOTE — Progress Notes (Signed)
   Patient Name: Stephanie Mercado, Stephanie Mercado DOB: Jan 20, 1991 MRN: [id number] Transferring facility: [facility] Requesting provider: pickering Reason for transfer: right hydronephrosis, left ureteral stone/pyelonephritis 32 yo WF with left sided flank pain. ??distal left ureteral stone. CT abd shows right hydro. EDP called urology(bell). started IV cipro. keep NPO. IVF. call Dr. Alvester Morin if pt clinical condition worsens. Going to: [facility] Admission Status: observation Bed Type: med/surg To Do:  [facility] will assume care on arrival to accepting facility. Until arrival, medical decision making responsibilities remain with the EDP.  However, [facility] available 24/7 for questions and assistance.   Nursing staff please page [facility] Admits and Consults [phone]) as soon as the patient arrives to the hospital.  Carollee Herter, DO [facility]

## 2023-04-11 NOTE — ED Triage Notes (Signed)
Patient here POV from Home.  Endorses Left Sided Flank Pain the radiates to LLQ and Left Pelvic Area for 6 Hours. Some Dysuria.   NAD Noted during triage. A&Ox4. Gcs 15. Ambulatory.

## 2023-04-12 DIAGNOSIS — A419 Sepsis, unspecified organism: Secondary | ICD-10-CM

## 2023-04-12 DIAGNOSIS — N200 Calculus of kidney: Secondary | ICD-10-CM

## 2023-04-12 DIAGNOSIS — N390 Urinary tract infection, site not specified: Secondary | ICD-10-CM

## 2023-04-12 DIAGNOSIS — F1090 Alcohol use, unspecified, uncomplicated: Secondary | ICD-10-CM

## 2023-04-12 HISTORY — DX: Urinary tract infection, site not specified: N39.0

## 2023-04-12 HISTORY — DX: Sepsis, unspecified organism: A41.9

## 2023-04-12 LAB — COMPREHENSIVE METABOLIC PANEL
ALT: 27 U/L (ref 0–44)
AST: 23 U/L (ref 15–41)
Albumin: 3.5 g/dL (ref 3.5–5.0)
Alkaline Phosphatase: 67 U/L (ref 38–126)
Anion gap: 7 (ref 5–15)
BUN: 11 mg/dL (ref 6–20)
CO2: 27 mmol/L (ref 22–32)
Calcium: 8.7 mg/dL — ABNORMAL LOW (ref 8.9–10.3)
Chloride: 103 mmol/L (ref 98–111)
Creatinine, Ser: 0.98 mg/dL (ref 0.44–1.00)
GFR, Estimated: >60 mL/min (ref >60)
Glucose, Bld: 101 mg/dL — ABNORMAL HIGH (ref 70–99)
Potassium: 3.4 mmol/L — ABNORMAL LOW (ref 3.5–5.1)
Sodium: 137 mmol/L (ref 135–145)
Total Bilirubin: 0.8 mg/dL (ref 0.3–1.2)
Total Protein: 6.5 g/dL (ref 6.5–8.1)

## 2023-04-12 LAB — CBC
HCT: 38.5 % (ref 36.0–46.0)
Hemoglobin: 13.1 g/dL (ref 12.0–15.0)
MCH: 31.6 pg (ref 26.0–34.0)
MCHC: 34 g/dL (ref 30.0–36.0)
MCV: 93 fL (ref 80.0–100.0)
Platelets: 102 10*3/uL — ABNORMAL LOW (ref 150–400)
RBC: 4.14 MIL/uL (ref 3.87–5.11)
RDW: 12.2 % (ref 11.5–15.5)
WBC: 6.7 10*3/uL (ref 4.0–10.5)
nRBC: 0 % (ref 0.0–0.2)

## 2023-04-12 LAB — URINE CULTURE

## 2023-04-12 LAB — HIV ANTIBODY (ROUTINE TESTING W REFLEX): HIV Screen 4th Generation wRfx: NONREACTIVE

## 2023-04-12 LAB — LACTIC ACID, PLASMA
Lactic Acid, Venous: 1.3 mmol/L (ref 0.5–1.9)
Lactic Acid, Venous: 0.9 mmol/L (ref 0.5–1.9)

## 2023-04-12 MED ORDER — ADULT MULTIVITAMIN W/MINERALS CH
1.0000 | ORAL_TABLET | Freq: Every day | ORAL | Status: DC
  Administered 2023-04-12: 1 via ORAL
  Filled 2023-04-12: qty 1

## 2023-04-12 MED ORDER — LORAZEPAM 2 MG/ML IJ SOLN
1.0000 mg | INTRAMUSCULAR | Status: DC | PRN
  Administered 2023-04-12 (×2): 1 mg via INTRAVENOUS
  Filled 2023-04-12 (×2): qty 1

## 2023-04-12 MED ORDER — THIAMINE HCL 100 MG/ML IJ SOLN: 100.0000 mg | Freq: Every day | INTRAMUSCULAR | Status: DC

## 2023-04-12 MED ORDER — KETOROLAC TROMETHAMINE 15 MG/ML IJ SOLN
15.0000 mg | Freq: Three times a day (TID) | INTRAMUSCULAR | Status: DC | PRN
  Administered 2023-04-12: 15 mg via INTRAVENOUS
  Filled 2023-04-12: qty 1

## 2023-04-12 MED ORDER — NALOXONE HCL 0.4 MG/ML IJ SOLN: 0.4000 mg | INTRAMUSCULAR | Status: DC | PRN

## 2023-04-12 MED ORDER — LORAZEPAM 1 MG PO TABS: 1.0000 mg | ORAL_TABLET | ORAL | Status: DC | PRN

## 2023-04-12 MED ORDER — HYDROCODONE-ACETAMINOPHEN 7.5-325 MG PO TABS: 1.0000 | ORAL_TABLET | Freq: Four times a day (QID) | ORAL | 0 refills | Status: AC | PRN

## 2023-04-12 MED ORDER — HYDROMORPHONE HCL 1 MG/ML IJ SOLN
1.0000 mg | INTRAMUSCULAR | Status: DC | PRN
  Administered 2023-04-12 (×2): 1 mg via INTRAVENOUS
  Filled 2023-04-12 (×2): qty 1

## 2023-04-12 MED ORDER — THIAMINE MONONITRATE 100 MG PO TABS
100.0000 mg | ORAL_TABLET | Freq: Every day | ORAL | Status: DC
  Administered 2023-04-12: 100 mg via ORAL
  Filled 2023-04-12: qty 1

## 2023-04-12 MED ORDER — CIPROFLOXACIN IN D5W 400 MG/200ML IV SOLN
400.0000 mg | Freq: Two times a day (BID) | INTRAVENOUS | Status: DC
  Administered 2023-04-12: 400 mg via INTRAVENOUS
  Filled 2023-04-12: qty 200

## 2023-04-12 MED ORDER — SODIUM CHLORIDE 0.9 % IV SOLN: INTRAVENOUS | Status: DC

## 2023-04-12 MED ORDER — CIPROFLOXACIN HCL 500 MG PO TABS: 500.0000 mg | ORAL_TABLET | Freq: Two times a day (BID) | ORAL | 0 refills | Status: DC

## 2023-04-12 MED ORDER — FOLIC ACID 1 MG PO TABS
1.0000 mg | ORAL_TABLET | Freq: Every day | ORAL | Status: DC
  Administered 2023-04-12: 1 mg via ORAL
  Filled 2023-04-12: qty 1

## 2023-04-12 MED ORDER — POTASSIUM CHLORIDE CRYS ER 20 MEQ PO TBCR
40.0000 meq | EXTENDED_RELEASE_TABLET | Freq: Once | ORAL | Status: AC
  Administered 2023-04-12: 40 meq via ORAL
  Filled 2023-04-12: qty 2

## 2023-04-12 MED ORDER — CIPROFLOXACIN HCL 500 MG PO TABS: 500.0000 mg | ORAL_TABLET | Freq: Two times a day (BID) | ORAL | Status: DC

## 2023-04-12 NOTE — TOC Transition Note (Signed)
Transition of Care [facility]) - CM/SW Discharge Note   Patient Details  Name: Stephanie Mercado MRN: [id number] Date of Birth: Feb 18, 1991  Transition [facility]) CM/SW Contact:  Otelia Santee, LCSW Phone Number: 04/12/2023, 10:44 AM   Clinical Narrative:    [facility] consulted for SA education/counseling. Py currently declining all substance use resources. Pt shares her primary concern is obtaining insurance. CSW encouraged pt to apply for Medicaid and placed information on AVS. Pt agreeable to having PCP appointment scheduled at a [facility]. PCP appointment has been scheduled at the [facility] on Friday, 04/14/23 at 2:20pm. Information placed on follow up.          Patient Goals and CMS Choice CMS Medicare.gov Compare Post Acute Care list provided to:: Patient Choice offered to / list presented to : Patient  Discharge Placement                         Discharge Plan and Services Additional resources added to the After Visit Summary for                  DME Arranged: N/A DME Agency: NA                  Social Determinants of Health (SDOH) Interventions SDOH Screenings   Food Insecurity: No Food Insecurity (04/12/2023)  Housing: Low Risk  (04/12/2023)  Transportation Needs: No Transportation Needs (04/12/2023)  Utilities: Not At Risk (04/12/2023)  Tobacco Use: Low Risk  (04/11/2023)     Readmission Risk Interventions     No data to display

## 2023-04-12 NOTE — Plan of Care (Signed)

## 2023-04-12 NOTE — Consult Note (Signed)
Urology Consult  Referring physician: Dr. Benjamine Mola Reason for referral: Flank pain, UPJ obstruction, ureteral stone  Chief Complaint: Flank pain  History of Present Illness:  Stephanie Mercado is a 32 year old female presenting to [facility] emergency department on 04/11/2023 with flank pain x 6 hours radiating down to the left groin.  CT A/P revealed likely recently passed left ureteral stone and right-sided UPJ obstruction with severe hydronephrosis.  [facility] was consulted to speak to the above findings.   On my assessment the following morning patient was alert, oriented, in no distress.  She was eager to have breakfast.  We reviewed her case and plan and she expressed understanding that she did not have an obstructive uropathy on the left side from the stone, but needed to follow-up closely for the right side UPJ obstruction in the interest of her long-term renal health.   Past Medical History:  Diagnosis Date   Allergy    Eczema    Migraines    Past Surgical History:  Procedure Laterality Date   TONSILLECTOMY     WISDOM TOOTH EXTRACTION      Medications: I have reviewed the patient's current medications. Allergies:  Allergies  Allergen Reactions   Penicillins Hives    Has patient had a PCN reaction causing immediate rash, facial/tongue/throat swelling, SOB or lightheadedness with hypotension: Yes Has patient had a PCN reaction causing severe rash involving mucus membranes or skin necrosis: No Has patient had a PCN reaction that required hospitalization No Has patient had a PCN reaction occurring within the last 10 years: No If all of the above answers are "NO", then may proceed with Cephalosporin use.    Rocephin [Ceftriaxone] Other (See Comments)    Muscle cramps in back (lasted about 8 hrs)    Family History  Problem Relation Age of Onset   Cancer Mother    Kidney disease Mother    Diabetes Paternal Grandfather    Social History:  reports that she has never smoked.  She has never used smokeless tobacco. She reports current alcohol use. She reports that she does not currently use drugs after having used the following drugs: Marijuana.  ROS: All systems are reviewed and negative except as noted.   Physical Exam:  Vital signs in last 24 hours: Temp:  [97.1 F (36.2 C)-99.6 F (37.6 C)] 98.7 F (37.1 C) (05/29 1610) Pulse Rate:  [103-118] 103 (05/29 0633) Resp:  [16-23] 20 (05/29 9604) BP: (114-161)/(57-116) 141/87 (05/29 0633) SpO2:  [95 %-100 %] 97 % (05/29 5409) Weight:  [86.2 kg] 86.2 kg (05/28 1835)  Cardiovascular: Skin warm; not flushed Respiratory: Breaths quiet; no shortness of breath Abdomen: No masses, soft, no guarding or rebound Neurological: Normal sensation to touch Musculoskeletal: Normal motor function arms and legs Skin: No rashes Genitourinary: No urologic complaints.  Laboratory Data:  Results for orders placed or performed during the hospital encounter of 04/11/23 (from the past 72 hour(s))  Pregnancy, urine     Status: None   Collection Time: 04/11/23  6:40 PM  Result Value Ref Range   Preg Test, Ur NEGATIVE NEGATIVE    Comment:        THE SENSITIVITY OF THIS METHODOLOGY IS >20 mIU/mL. Performed at [facility], [address] [place], [place] [address]   Urinalysis, w/ Reflex to Culture (Infection Suspected) -Urine, Clean Catch     Status: Abnormal   Collection Time: 04/11/23  6:40 PM  Result Value Ref Range   Specimen Source URINE, CLEAN  CATCH    Color, Urine YELLOW YELLOW   APPearance HAZY (A) CLEAR   Specific Gravity, Urine 1.010 1.005 - 1.030   pH 6.5 5.0 - 8.0   Glucose, UA NEGATIVE NEGATIVE mg/dL   Hgb urine dipstick SMALL (A) NEGATIVE   Bilirubin Urine NEGATIVE NEGATIVE   Ketones, ur NEGATIVE NEGATIVE mg/dL   Protein, ur NEGATIVE NEGATIVE mg/dL   Nitrite POSITIVE (A) NEGATIVE   Leukocytes,Ua LARGE (A) NEGATIVE   RBC / HPF 6-10 0 - 5 RBC/hpf   WBC, UA >50 0 - 5 WBC/hpf    Comment:         Reflex urine culture not performed if WBC <=10, OR if Squamous epithelial cells >5. If Squamous epithelial cells >5 suggest recollection.    Bacteria, UA FEW (A) NONE SEEN   Squamous Epithelial / HPF 0-5 0 - 5 /HPF   Budding Yeast PRESENT     Comment: Performed at [facility], [address] [place], [place] [address]  Basic metabolic panel     Status: Abnormal   Collection Time: 04/11/23  6:40 PM  Result Value Ref Range   Sodium 136 135 - 145 mmol/L   Potassium 3.7 3.5 - 5.1 mmol/L   Chloride 101 98 - 111 mmol/L   CO2 22 22 - 32 mmol/L   Glucose, Bld 223 (H) 70 - 99 mg/dL    Comment: Glucose reference range applies only to samples taken after fasting for at least 8 hours.   BUN 13 6 - 20 mg/dL   Creatinine, Ser 6.04 0.44 - 1.00 mg/dL   Calcium 9.8 8.9 - 54.0 mg/dL   GFR, Estimated >98 >11 mL/min    Comment: (NOTE) Calculated using the CKD-EPI Creatinine Equation (2021)    Anion gap 13 5 - 15    Comment: Performed at [facility], [address], [place], [place] [address]  CBC with Differential     Status: Abnormal   Collection Time: 04/11/23  6:40 PM  Result Value Ref Range   WBC 14.1 (H) 4.0 - 10.5 K/uL   RBC 4.44 3.87 - 5.11 MIL/uL   Hemoglobin 14.4 12.0 - 15.0 g/dL   HCT 29.5 62.1 - 30.8 %   MCV 90.3 80.0 - 100.0 fL   MCH 32.4 26.0 - 34.0 pg   MCHC 35.9 30.0 - 36.0 g/dL   RDW 65.7 84.6 - 96.2 %   Platelets 149 (L) 150 - 400 K/uL   nRBC 0.0 0.0 - 0.2 %   Neutrophils Relative % 86 %   Neutro Abs 12.0 (H) 1.7 - 7.7 K/uL   Lymphocytes Relative 7 %   Lymphs Abs 1.0 0.7 - 4.0 K/uL   Monocytes Relative 5 %   Monocytes Absolute 0.8 0.1 - 1.0 K/uL   Eosinophils Relative 1 %   Eosinophils Absolute 0.1 0.0 - 0.5 K/uL   Basophils Relative 0 %   Basophils Absolute 0.0 0.0 - 0.1 K/uL   Immature Granulocytes 1 %   Abs Immature Granulocytes 0.07 0.00 - 0.07 K/uL    Comment: Performed at [facility], [address] [place], [place] [address]  CBC     Status: Abnormal   Collection Time: 04/12/23  5:49 AM  Result Value Ref Range   WBC 6.7 4.0 - 10.5 K/uL   RBC 4.14 3.87 - 5.11 MIL/uL   Hemoglobin 13.1 12.0 - 15.0 g/dL   HCT 13.2 44.0 - 10.2 %   MCV 93.0 80.0 - 100.0 fL  MCH 31.6 26.0 - 34.0 pg   MCHC 34.0 30.0 - 36.0 g/dL   RDW 16.1 09.6 - 04.5 %   Platelets 102 (L) 150 - 400 K/uL   nRBC 0.0 0.0 - 0.2 %    Comment: Performed at [facility], [address], [place], [place] [address]  Comprehensive metabolic panel     Status: Abnormal   Collection Time: 04/12/23  5:49 AM  Result Value Ref Range   Sodium 137 135 - 145 mmol/L   Potassium 3.4 (L) 3.5 - 5.1 mmol/L   Chloride 103 98 - 111 mmol/L   CO2 27 22 - 32 mmol/L   Glucose, Bld 101 (H) 70 - 99 mg/dL    Comment: Glucose reference range applies only to samples taken after fasting for at least 8 hours.   BUN 11 6 - 20 mg/dL   Creatinine, Ser 1.91 0.44 - 1.00 mg/dL   Calcium 8.7 (L) 8.9 - 10.3 mg/dL   Total Protein 6.5 6.5 - 8.1 g/dL   Albumin 3.5 3.5 - 5.0 g/dL   AST 23 15 - 41 U/L   ALT 27 0 - 44 U/L   Alkaline Phosphatase 67 38 - 126 U/L   Total Bilirubin 0.8 0.3 - 1.2 mg/dL   GFR, Estimated >47 >82 mL/min    Comment: (NOTE) Calculated using the CKD-EPI Creatinine Equation (2021)    Anion gap 7 5 - 15    Comment: Performed at [facility], [address], [place], [place] [address]  Lactic acid, plasma     Status: None   Collection Time: 04/12/23  5:49 AM  Result Value Ref Range   Lactic Acid, Venous 1.3 0.5 - 1.9 mmol/L    Comment: Performed at [facility], [address], [place], [place] [address]   No results found for this or any previous visit (from the past 240 hour(s)). Creatinine: Recent Labs    04/11/23 1840 04/12/23 0549  CREATININE 0.79 0.98    Imaging: See report/chart  Independent review of CT A/P shows a likely small ureteral stone on  the left side that had passed into the bladder.  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  Assessment/Plan:  Almost all of her pain has been on the left side which should quickly resolve if her stone has passed into the bladder, which it appears it has.  She reports some minor discomfort on the right but it has not been a limiting factor.  Leukocytosis has normalized overnight.  Serum creatinine has been within normal range.  1 week total ABX for nitrite positive UTI  Unfortunately patient does not have any insurance and CT urogram, and any resultant surgery would be cost prohibitive at this point.  She wishes to try to get coverage and then follow-up as soon as possible with [facility].  Okay to discharge from urologic perspective   Scherrie Bateman Kross Swallows 04/12/2023, 8:36 AM  Pager: [phone]

## 2023-04-12 NOTE — Progress Notes (Signed)
Pharmacy Antibiotic Note  Stephanie Mercado is a 32 y.o. female admitted on 04/11/2023 with left lower quadrant and left flank pain, possible stone on the left side at the UVJ about to pass.  Pharmacy has been consulted for cipro dosing.  Plan: Cipro 400mg  IV q12h Follow renal function, cultures and clincial course  Height: 5\' 7"  (170.2 cm) Weight: 86.2 kg (190 lb) IBW/kg (Calculated) : 61.6  Temp (24hrs), Avg:98.1 F (36.7 C), Min:97.1 F (36.2 C), Max:98.9 F (37.2 C)  Recent Labs  Lab 04/11/23 1840  WBC 14.1*  CREATININE 0.79    Estimated Creatinine Clearance: 113.8 mL/min (by C-G formula based on SCr of 0.79 mg/dL).    Allergies  Allergen Reactions   Penicillins Hives    Has patient had a PCN reaction causing immediate rash, facial/tongue/throat swelling, SOB or lightheadedness with hypotension: Yes Has patient had a PCN reaction causing severe rash involving mucus membranes or skin necrosis: No Has patient had a PCN reaction that required hospitalization No Has patient had a PCN reaction occurring within the last 10 years: No If all of the above answers are "NO", then may proceed with Cephalosporin use.    Rocephin [Ceftriaxone] Other (See Comments)    Muscle cramps in back (lasted about 8 hrs)    Antimicrobials this admission: 5/28 cipro >>  Dose adjustments this admission:   Microbiology results: 5/28 BCx:  5/28 UCx:   Thank you for allowing pharmacy to be a part of this patient's care.  Arley Phenix RPh 04/12/2023, 1:50 AM

## 2023-04-12 NOTE — H&P (Signed)
History and Physical    Stephanie Mercado:[id number] DOB: 11-14-1991 DOA: 04/11/2023  PCP: Patient, No Pcp Per  Patient coming from: [facility] ED  Chief Complaint: Flank pain  HPI: Stephanie Mercado is a 32 y.o. female with medical history significant of eczema, allergic rhinitis, migraines, marijuana abuse, depression with prior suicide attempt/intentional drug overdose, alcohol use disorder, ADD, nephrolithiasis presented to ED with complaints of left flank pain radiating to left groin, dysuria, and chills.  In the ED, patient tachycardic with heart rate in the 110s but afebrile and not hypotensive.  Labs significant for WBC 14.1, hemoglobin 14.4, platelet count 149k, creatinine 0.7, urine pregnancy test negative.  UA with positive nitrite, large amount of leukocytes, and microscopy showing 6-10 RBCs, >50 WBCs, and few bacteria.  Urine culture pending. CT renal stone study showing severe right hydronephrosis with change in caliber at the ureteropelvic junction which may reflect a recently passed stone or non-radiopaque stone versus chronic changes of obstructive uropathy or reflux. ED physician discussed the case with urology (Dr. Alvester Morin) who felt that there could be a stone on the left side at the UVJ about to pass. Advised hospital admission for antibiotics and IV fluids.  Keep n.p.o., consult urology if patient worsens tonight.  Patient received Dilaudid 0.5 mg x 2, Toradol 15 mg, and IV ciprofloxacin in the ED.  Patient states since 22 PM yesterday she has been having severe left-sided flank pain which radiates to her left groin and also having dysuria.  Not having any pain in her abdomen.  Reports chills and one episode of vomiting at home.  She reports history of kidney stones.  No other complaints.  Denies cough, shortness breath, or chest pain.  Review of Systems:  Review of Systems  All other systems reviewed and are negative.   Past Medical History:  Diagnosis Date   Allergy    Eczema    Migraines      Past Surgical History:  Procedure Laterality Date   TONSILLECTOMY     WISDOM TOOTH EXTRACTION       reports that she has never smoked. She has never used smokeless tobacco. She reports current alcohol use. She reports that she does not currently use drugs after having used the following drugs: Marijuana.  Allergies  Allergen Reactions   Penicillins Hives    Has patient had a PCN reaction causing immediate rash, facial/tongue/throat swelling, SOB or lightheadedness with hypotension: Yes Has patient had a PCN reaction causing severe rash involving mucus membranes or skin necrosis: No Has patient had a PCN reaction that required hospitalization No Has patient had a PCN reaction occurring within the last 10 years: No If all of the above answers are "NO", then may proceed with Cephalosporin use.    Rocephin [Ceftriaxone] Other (See Comments)    Muscle cramps in back (lasted about 8 hrs)    Family History  Problem Relation Age of Onset   Cancer Mother    Kidney disease Mother    Diabetes Paternal Grandfather     Prior to Admission medications   Medication Sig Start Date End Date Taking? Authorizing Provider  hydrOXYzine (ATARAX/VISTARIL) 25 MG tablet Take 1 tablet (25 mg total) by mouth every 6 (six) hours as needed for anxiety. 4 tabs daily as needed every 6 hours 02/09/18   Money, Gerlene Burdock, FNP  Levonorgestrel (KYLEENA) 19.5 MG IUD 1 each by Intrauterine route once.    [provider]  sertraline (ZOLOFT) 25 MG tablet Take 1  tablet (25 mg total) by mouth daily. 1 tab daily for mood control 02/10/18   Maryfrances Bunnell, [place]    Physical Exam: Vitals:   04/11/23 2113 04/11/23 2130 04/11/23 2145 04/11/23 2228  BP:  (!) 140/92 (!) 114/57 (!) 152/91  Pulse:  (!) 117 (!) 115 (!) 110  Resp:  20 20 16   Temp: 98.3 F (36.8 C)   98.9 F (37.2 C)  TempSrc: Oral     SpO2:  97% 100% 97%  Weight:      Height:        Physical Exam Vitals reviewed.  Constitutional:       General: She is not in acute distress. HENT:     Head: Normocephalic and atraumatic.  Eyes:     Extraocular Movements: Extraocular movements intact.  Cardiovascular:     Rate and Rhythm: Regular rhythm. Tachycardia present.     Pulses: Normal pulses.  Pulmonary:     Effort: Pulmonary effort is normal. No respiratory distress.     Breath sounds: Normal breath sounds. No wheezing or rales.  Abdominal:     General: Bowel sounds are normal. There is no distension.     Palpations: Abdomen is soft.     Tenderness: There is no abdominal tenderness. There is no guarding.  Musculoskeletal:     Cervical back: Normal range of motion.     Right lower leg: No edema.     Left lower leg: No edema.  Skin:    General: Skin is warm and dry.  Neurological:     General: No focal deficit present.     Mental Status: She is alert and oriented to person, place, and time.     Labs on Admission: I have personally reviewed following labs and imaging studies  CBC: Recent Labs  Lab 04/11/23 1840  WBC 14.1*  NEUTROABS 12.0*  HGB 14.4  HCT 40.1  MCV 90.3  PLT 149*   Basic Metabolic Panel: Recent Labs  Lab 04/11/23 1840  NA 136  K 3.7  CL 101  CO2 22  GLUCOSE 223*  BUN 13  CREATININE 0.79  CALCIUM 9.8   GFR: Estimated Creatinine Clearance: 113.8 mL/min (by C-G formula based on SCr of 0.79 mg/dL). Liver Function Tests: No results for input(s): "AST", "ALT", "ALKPHOS", "BILITOT", "PROT", "ALBUMIN" in the last 168 hours. No results for input(s): "LIPASE", "AMYLASE" in the last 168 hours. No results for input(s): "AMMONIA" in the last 168 hours. Coagulation Profile: No results for input(s): "INR", "PROTIME" in the last 168 hours. Cardiac Enzymes: No results for input(s): "CKTOTAL", "CKMB", "CKMBINDEX", "TROPONINI" in the last 168 hours. BNP (last 3 results) No results for input(s): "PROBNP" in the last 8760 hours. HbA1C: No results for input(s): "HGBA1C" in the last 72 hours. CBG: No  results for input(s): "GLUCAP" in the last 168 hours. Lipid Profile: No results for input(s): "CHOL", "HDL", "LDLCALC", "TRIG", "CHOLHDL", "LDLDIRECT" in the last 72 hours. Thyroid Function Tests: No results for input(s): "TSH", "T4TOTAL", "FREET4", "T3FREE", "THYROIDAB" in the last 72 hours. Anemia Panel: No results for input(s): "VITAMINB12", "FOLATE", "FERRITIN", "TIBC", "IRON", "RETICCTPCT" in the last 72 hours. Urine analysis:    Component Value Date/Time   COLORURINE YELLOW 04/11/2023 1840   APPEARANCEUR HAZY (A) 04/11/2023 1840   LABSPEC 1.010 04/11/2023 1840   PHURINE 6.5 04/11/2023 1840   GLUCOSEU NEGATIVE 04/11/2023 1840   HGBUR SMALL (A) 04/11/2023 1840   BILIRUBINUR NEGATIVE 04/11/2023 1840   KETONESUR NEGATIVE 04/11/2023 1840   PROTEINUR  NEGATIVE 04/11/2023 1840   UROBILINOGEN 1.0 07/13/2014 2015   NITRITE POSITIVE (A) 04/11/2023 1840   LEUKOCYTESUR LARGE (A) 04/11/2023 1840    Radiological Exams on Admission: CT Renal Stone Study  Result Date: 04/11/2023 CLINICAL DATA:  Abdominal/flank pain, stone suspected EXAM: CT ABDOMEN AND PELVIS WITHOUT CONTRAST TECHNIQUE: Multidetector CT imaging of the abdomen and pelvis was performed following the standard protocol without IV contrast. RADIATION DOSE REDUCTION: This exam was performed according to the departmental dose-optimization program which includes automated exposure control, adjustment of the mA and/or kV according to patient size and/or use of iterative reconstruction technique. COMPARISON:  CT abdomen pelvis 07/12/2014 FINDINGS: Lower chest: No acute abnormality. Hepatobiliary: No focal liver abnormality. No gallstones, gallbladder wall thickening, or pericholecystic fluid. No biliary dilatation. Pancreas: No focal lesion. Normal pancreatic contour. No surrounding inflammatory changes. No main pancreatic ductal dilatation. Spleen: Normal in size without focal abnormality. Adrenals/Urinary Tract: No adrenal nodule  bilaterally. No nephrolithiasis and no hydronephrosis. Severe right hydronephrosis with change in caliber at the ureteropelvic junction. No right hydroureter. No nephroureterolithiasis. No left hydronephrosis or ureterolithiasis. The urinary bladder is unremarkable. Stomach/Bowel: Stomach is within normal limits. No evidence of bowel wall thickening or dilatation. Appendix appears normal. Vascular/Lymphatic: No abdominal aorta or iliac aneurysm. Mild atherosclerotic plaque of the aorta and its branches. No abdominal, pelvic, or inguinal lymphadenopathy. Reproductive: Uterus and bilateral adnexa are unremarkable. Other: No intraperitoneal free fluid. No intraperitoneal free gas. No organized fluid collection. Musculoskeletal: No abdominal wall hernia or abnormality. No suspicious lytic or blastic osseous lesions. No acute displaced fracture. IMPRESSION: Severe right hydronephrosis with change in caliber at the ureteropelvic junction. Limited evaluation on this noncontrast study. This may reflect a recently passed stone or non-radioopaque stone versus chronic changes of obstructive uropathy or reflux. Electronically Signed   By: Tish Frederickson M.D.   On: 04/11/2023 20:07    EKG: Pending at this time.  Assessment and Plan  Sepsis secondary to acute complicated UTI/possible obstructing kidney stone Patient presenting with complaints of left flank pain radiating to her left groin, dysuria, chills, and one episode of vomiting since yesterday.  Tachycardic to the 110s but afebrile and not hypotensive.  WBC 14.1, creatinine 0.7. UA with positive nitrite, large amount of leukocytes, and microscopy showing 6-10 RBCs, >50 WBCs, and few bacteria. CT renal stone study showing severe right hydronephrosis with change in caliber at the ureteropelvic junction which may reflect a recently passed stone or non-radiopaque stone versus chronic changes of obstructive uropathy or reflux. ED physician discussed the case with urology  (Dr. Alvester Morin) who felt that there could be a stone on the left side at the UVJ about to pass.  He recommended antibiotics and IV fluids. Continue IV ciprofloxacin (penicillin allergy) and IV fluid hydration.  Continue pain management with IV Dilaudid PRN and IV Toradol PRN.  Keep n.p.o., consult urology if patient worsens tonight.  No blood cultures drawn in the ED, ordered now.  Also check lactate x 2.  Urine culture pending.  Continue to monitor WBC count and renal function.  Alcohol use disorder Tachycardic in the setting of infection and pain but no other signs of withdrawal at this time.  Placed on CIWA protocol; Ativan as needed.  Thiamine, folate, and multivitamin.  Depression ADD Eczema Allergic rhinitis Pharmacy med rec pending.  DVT prophylaxis: SCDs Code Status: Full Code (discussed with the patient) Level of care: Telemetry bed Admission status: It is my clinical opinion that referral for OBSERVATION is reasonable and  necessary in this patient based on the above information provided. The aforementioned taken together are felt to place the patient at high risk for further clinical deterioration. However, it is anticipated that the patient may be medically stable for discharge from the hospital within 24 to 48 hours.   John Giovanni MD [facility]  If 7PM-7AM, please contact night-coverage [email or web address]  04/12/2023, 12:46 AM

## 2023-04-12 NOTE — Discharge Summary (Signed)
Physician Discharge Summary  Stephanie Mercado:[id number] DOB: 05-Nov-1991 DOA: 04/11/2023  PCP: Patient, No Pcp Per  Admit date: 04/11/2023 Discharge date: 04/12/2023  Admitted From: home Discharge disposition: home   Recommendations for Outpatient Follow-Up:   Per urology: Almost all of her pain has been on the left side which should quickly resolve if her stone has passed into the bladder, which it appears it has.  She reports some minor discomfort on the right but it has not been a limiting factor. Leukocytosis has normalized overnight.  Serum creatinine has been within normal range.  1 week total ABX for nitrite positive UTI.  Unfortunately patient does not have any insurance and CT urogram, and any resultant surgery would be cost prohibitive at this point.  She wishes to try to get coverage and then follow-up as soon as possible with [facility]. Okay to discharge from urologic perspective   Discharge Diagnosis:   Principal Problem:   Sepsis (HCC) Active Problems:   ADD (attention deficit disorder)   Major depressive disorder   Complicated UTI (urinary tract infection)   Alcohol use disorder    Discharge Condition: Improved.  Diet recommendation:   Regular.  Wound care: None.  Code status: Full.   History of Present Illness:   Stephanie Mercado is a 32 y.o. female with medical history significant of eczema, allergic rhinitis, migraines, marijuana abuse, depression with prior suicide attempt/intentional drug overdose, alcohol use disorder, ADD, nephrolithiasis presented to ED with complaints of left flank pain radiating to left groin, dysuria, and chills.  In the ED, patient tachycardic with heart rate in the 110s but afebrile and not hypotensive.  Labs significant for WBC 14.1, hemoglobin 14.4, platelet count 149k, creatinine 0.7, urine pregnancy test negative.  UA with positive nitrite, large amount of leukocytes, and microscopy showing 6-10 RBCs, >50 WBCs, and few  bacteria.  Urine culture pending. CT renal stone study showing severe right hydronephrosis with change in caliber at the ureteropelvic junction which may reflect a recently passed stone or non-radiopaque stone versus chronic changes of obstructive uropathy or reflux. ED physician discussed the case with urology (Dr. Alvester Morin) who felt that there could be a stone on the left side at the UVJ about to pass. Advised hospital admission for antibiotics and IV fluids.  Keep n.p.o., consult urology if patient worsens tonight.  Patient received Dilaudid 0.5 mg x 2, Toradol 15 mg, and IV ciprofloxacin in the ED.   Patient states since 20 PM yesterday she has been having severe left-sided flank pain which radiates to her left groin and also having dysuria.  Not having any pain in her abdomen.  Reports chills and one episode of vomiting at home.  She reports history of kidney stones.  No other complaints.  Denies cough, shortness breath, or chest pain.   Hospital Course by Problem:   Sepsis secondary to acute complicated UTI/possible obstructing kidney stone Per urology:  Almost all of her pain has been on the left side which should quickly resolve if her stone has passed into the bladder, which it appears it has.  She reports some minor discomfort on the right but it has not been a limiting factor. Leukocytosis has normalized overnight.  Serum creatinine has been within normal range.  1 week total ABX for nitrite positive UTI.  Unfortunately patient does not have any insurance and CT urogram, and any resultant surgery would be cost prohibitive at this point.  She wishes to try to  get coverage and then follow-up as soon as possible with [facility]. Okay to discharge from urologic perspective -will give 1 week of cipro \  Alcohol use disorder Tachycardic in the setting of infection and pain but no other signs of withdrawal at this time. -encouraged cessation   Hypokalemia -replete   Medical Consultants:    urology   Discharge Exam:   Vitals:   04/12/23 0235 04/12/23 0633  BP: 125/75 (!) 141/87  Pulse:  (!) 103  Resp: 18 20  Temp: 99.6 F (37.6 C) 98.7 F (37.1 C)  SpO2: 97% 97%   Vitals:   04/11/23 2145 04/11/23 2228 04/12/23 0235 04/12/23 0633  BP: (!) 114/57 (!) 152/91 125/75 (!) 141/87  Pulse: (!) 115 (!) 110  (!) 103  Resp: 20 16 18 20   Temp:  98.9 F (37.2 C) 99.6 F (37.6 C) 98.7 F (37.1 C)  TempSrc:   Oral Oral  SpO2: 100% 97% 97% 97%  Weight:      Height:        General exam: Appears calm and comfortable.   The results of significant diagnostics from this hospitalization (including imaging, microbiology, ancillary and laboratory) are listed below for reference.     Procedures and Diagnostic Studies:   CT Renal Stone Study  Result Date: 04/11/2023 CLINICAL DATA:  Abdominal/flank pain, stone suspected EXAM: CT ABDOMEN AND PELVIS WITHOUT CONTRAST TECHNIQUE: Multidetector CT imaging of the abdomen and pelvis was performed following the standard protocol without IV contrast. RADIATION DOSE REDUCTION: This exam was performed according to the departmental dose-optimization program which includes automated exposure control, adjustment of the mA and/or kV according to patient size and/or use of iterative reconstruction technique. COMPARISON:  CT abdomen pelvis 07/12/2014 FINDINGS: Lower chest: No acute abnormality. Hepatobiliary: No focal liver abnormality. No gallstones, gallbladder wall thickening, or pericholecystic fluid. No biliary dilatation. Pancreas: No focal lesion. Normal pancreatic contour. No surrounding inflammatory changes. No main pancreatic ductal dilatation. Spleen: Normal in size without focal abnormality. Adrenals/Urinary Tract: No adrenal nodule bilaterally. No nephrolithiasis and no hydronephrosis. Severe right hydronephrosis with change in caliber at the ureteropelvic junction. No right hydroureter. No nephroureterolithiasis. No left hydronephrosis or  ureterolithiasis. The urinary bladder is unremarkable. Stomach/Bowel: Stomach is within normal limits. No evidence of bowel wall thickening or dilatation. Appendix appears normal. Vascular/Lymphatic: No abdominal aorta or iliac aneurysm. Mild atherosclerotic plaque of the aorta and its branches. No abdominal, pelvic, or inguinal lymphadenopathy. Reproductive: Uterus and bilateral adnexa are unremarkable. Other: No intraperitoneal free fluid. No intraperitoneal free gas. No organized fluid collection. Musculoskeletal: No abdominal wall hernia or abnormality. No suspicious lytic or blastic osseous lesions. No acute displaced fracture. IMPRESSION: Severe right hydronephrosis with change in caliber at the ureteropelvic junction. Limited evaluation on this noncontrast study. This may reflect a recently passed stone or non-radioopaque stone versus chronic changes of obstructive uropathy or reflux. Electronically Signed   By: Tish Frederickson M.D.   On: 04/11/2023 20:07     Labs:   Basic Metabolic Panel: Recent Labs  Lab 04/11/23 1840 04/12/23 0549  NA 136 137  K 3.7 3.4*  CL 101 103  CO2 22 27  GLUCOSE 223* 101*  BUN 13 11  CREATININE 0.79 0.98  CALCIUM 9.8 8.7*   GFR Estimated Creatinine Clearance: 92.9 mL/min (by C-G formula based on SCr of 0.98 mg/dL). Liver Function Tests: Recent Labs  Lab 04/12/23 0549  AST 23  ALT 27  ALKPHOS 67  BILITOT 0.8  PROT 6.5  ALBUMIN  3.5   No results for input(s): "LIPASE", "AMYLASE" in the last 168 hours. No results for input(s): "AMMONIA" in the last 168 hours. Coagulation profile No results for input(s): "INR", "PROTIME" in the last 168 hours.  CBC: Recent Labs  Lab 04/11/23 1840 04/12/23 0549  WBC 14.1* 6.7  NEUTROABS 12.0*  --   HGB 14.4 13.1  HCT 40.1 38.5  MCV 90.3 93.0  PLT 149* 102*   Cardiac Enzymes: No results for input(s): "CKTOTAL", "CKMB", "CKMBINDEX", "TROPONINI" in the last 168 hours. BNP: Invalid input(s):  "POCBNP" CBG: No results for input(s): "GLUCAP" in the last 168 hours. D-Dimer No results for input(s): "DDIMER" in the last 72 hours. Hgb A1c No results for input(s): "HGBA1C" in the last 72 hours. Lipid Profile No results for input(s): "CHOL", "HDL", "LDLCALC", "TRIG", "CHOLHDL", "LDLDIRECT" in the last 72 hours. Thyroid function studies No results for input(s): "TSH", "T4TOTAL", "T3FREE", "THYROIDAB" in the last 72 hours.  Invalid input(s): "FREET3" Anemia work up No results for input(s): "VITAMINB12", "FOLATE", "FERRITIN", "TIBC", "IRON", "RETICCTPCT" in the last 72 hours. Microbiology No results found for this or any previous visit (from the past 240 hour(s)).   Discharge Instructions:   Discharge Instructions     Diet general   Complete by: As directed    Discharge instructions   Complete by: As directed    Stay hydrated   Increase activity slowly   Complete by: As directed       Allergies as of 04/12/2023       Reactions   Penicillins Hives   Has patient had a PCN reaction causing immediate rash, facial/tongue/throat swelling, SOB or lightheadedness with hypotension: Yes Has patient had a PCN reaction causing severe rash involving mucus membranes or skin necrosis: No Has patient had a PCN reaction that required hospitalization No Has patient had a PCN reaction occurring within the last 10 years: No If all of the above answers are "NO", then may proceed with Cephalosporin use.   Rocephin [ceftriaxone] Other (See Comments)   Muscle cramps in back (lasted about 8 hrs)        Medication List     STOP taking these medications    hydrOXYzine 25 MG tablet Commonly known as: ATARAX   Kyleena 19.5 MG IUD Generic drug: levonorgestrel   sertraline 25 MG tablet Commonly known as: ZOLOFT       TAKE these medications    ciprofloxacin 500 MG tablet Commonly known as: CIPRO Take 1 tablet (500 mg total) by mouth 2 (two) times daily.    HYDROcodone-acetaminophen 7.5-325 MG tablet Commonly known as: Norco Take 1 tablet by mouth every 6 (six) hours as needed for up to 3 days for moderate pain.          Time coordinating discharge: 45 min  Signed:  Joseph Art DO  [facility] 04/12/2023, 9:14 AM

## 2023-04-13 LAB — URINE CULTURE: Culture: >=100000 — AB

## 2023-04-13 LAB — CULTURE, BLOOD (ROUTINE X 2): Culture: NO GROWTH

## 2023-04-14 ENCOUNTER — Inpatient Hospital Stay: Payer: Self-pay | Admitting: Nurse Practitioner

## 2023-04-15 LAB — CULTURE, BLOOD (ROUTINE X 2)
Culture: NO GROWTH
Special Requests: ADEQUATE

## 2023-04-16 LAB — CULTURE, BLOOD (ROUTINE X 2)

## 2023-04-17 LAB — CULTURE, BLOOD (ROUTINE X 2): Special Requests: ADEQUATE

## 2023-12-22 ENCOUNTER — Other Ambulatory Visit (HOSPITAL_BASED_OUTPATIENT_CLINIC_OR_DEPARTMENT_OTHER): Payer: Self-pay

## 2023-12-22 ENCOUNTER — Encounter (HOSPITAL_BASED_OUTPATIENT_CLINIC_OR_DEPARTMENT_OTHER): Payer: Self-pay | Admitting: Family Medicine

## 2023-12-22 ENCOUNTER — Ambulatory Visit (INDEPENDENT_AMBULATORY_CARE_PROVIDER_SITE_OTHER): Payer: Medicaid Other | Admitting: Family Medicine

## 2023-12-22 ENCOUNTER — Telehealth (HOSPITAL_BASED_OUTPATIENT_CLINIC_OR_DEPARTMENT_OTHER): Payer: Self-pay | Admitting: *Deleted

## 2023-12-22 VITALS — BP 134/90 | HR 121 | Ht 67.0 in | Wt 206.0 lb

## 2023-12-22 DIAGNOSIS — F39 Unspecified mood [affective] disorder: Secondary | ICD-10-CM | POA: Insufficient documentation

## 2023-12-22 DIAGNOSIS — F5101 Primary insomnia: Secondary | ICD-10-CM

## 2023-12-22 DIAGNOSIS — F900 Attention-deficit hyperactivity disorder, predominantly inattentive type: Secondary | ICD-10-CM

## 2023-12-22 DIAGNOSIS — Z87442 Personal history of urinary calculi: Secondary | ICD-10-CM | POA: Diagnosis not present

## 2023-12-22 DIAGNOSIS — D235 Other benign neoplasm of skin of trunk: Secondary | ICD-10-CM | POA: Insufficient documentation

## 2023-12-22 DIAGNOSIS — F509 Eating disorder, unspecified: Secondary | ICD-10-CM | POA: Insufficient documentation

## 2023-12-22 DIAGNOSIS — F331 Major depressive disorder, recurrent, moderate: Secondary | ICD-10-CM | POA: Diagnosis not present

## 2023-12-22 DIAGNOSIS — R030 Elevated blood-pressure reading, without diagnosis of hypertension: Secondary | ICD-10-CM | POA: Insufficient documentation

## 2023-12-22 DIAGNOSIS — Z803 Family history of malignant neoplasm of breast: Secondary | ICD-10-CM | POA: Insufficient documentation

## 2023-12-22 MED ORDER — DOXEPIN HCL 75 MG PO CAPS
75.0000 mg | ORAL_CAPSULE | Freq: Every day | ORAL | 5 refills | Status: AC
Start: 2023-12-22 — End: ?
  Filled 2023-12-22 – 2023-12-29 (×3): qty 30, 30d supply, fill #0

## 2023-12-22 MED ORDER — ARIPIPRAZOLE 5 MG PO TABS
5.0000 mg | ORAL_TABLET | Freq: Every day | ORAL | 5 refills | Status: AC
Start: 2023-12-22 — End: ?
  Filled 2023-12-22: qty 30, 30d supply, fill #0

## 2023-12-22 NOTE — Telephone Encounter (Signed)
 Copied from CRM [id number]. Topic: General - Running Late >> Dec 22, 2023 10:11 AM [place] wrote: Patient/patient representative is calling because they are running late for an appointment.  Not expecting to be late but there is road work slowing traffic.

## 2023-12-22 NOTE — Progress Notes (Signed)
 New Patient Office Visit  Subjective:   Stephanie Mercado 1991/06/29 12/22/2023  Chief Complaint  Patient presents with   New Patient (Initial Visit)    Patient is here to get established with the practice. Needs to have medications refilled and also needs a referral to urology due to history of kidney stones.    HPI: Stephanie Mercado presents today to establish care at [facility] at [facility]. Introduced to publishing rights manager role and practice setting.  All questions answered.   Last PCP: None Last annual physical: n/a  Concerns: See below    Lidocaine is a 33 year old female with a history of migraine headache, ADD, insomnia, depressive disorder, past moderate alcohol use disorder, PTSD, past history of cocaine abuse and long-term benzodiazepine use presenting to establish care.   Hx of RENAL STONES:  Patient reports a history of renal stones with most recent stone in May 2024.  Patient was recommended to follow-up with urologist post hospitalization in May 2024 due to CT renal stone study showing severe right hydronephrosis with a change in caliber at the ureteropelvic junction.  Concern for chronic changes of obstructive uropathy versus reflux.  Patient states she lost her insurance in the past year and was unable to establish with urologist.  She denies urinary symptoms, flank pain, fever or chills at this time but would like to establish with urology to consider possible preventative methods.   INSOMNIA: Stephanie Mercado presents for the medical management of Insomnia.  Duration: years Difficulty falling asleep: yes Difficulty staying asleep: no Good sleep hygiene: yes Current Medication : Doxepin  75mg  nightly.    Recent stress: no Daytime sleepiness: no Apnea: no Snoring: no History of sleep study: no    ADHD FOLLOW UP Patient presents for the medical management of ADD/ADHD.  Patient states she has been diagnosed with ADD since age 36.   She has been controlled well on Adderall in the past.  Her last prescription fill of this was Mar 30, 2022 per PDMP review.  She is requesting to restart her Adderall.  When asked to provide urine drug sample, patient states that she would fail due to [facility] use and was understanding that we cannot prescribe this due to use of marijuana at this time.  Patient also requested a refill of alprazolam  that she takes as needed for PTSD and anxiety.  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   Previous medications: Adderall 20mg   DEPRESSION: Stephanie Mercado presents for the medical management of depression.  Current medication regimen: Abilify  5mg  Counseling: Recommended Well controlled: Yes, currently Denies SI/HI.      12/22/2023   10:40 AM  GAD 7 : Generalized Anxiety Score  Nervous, Anxious, on Edge 0  Control/stop worrying 0  Worry too much - different things 0  Trouble relaxing 1  Restless 0  Easily annoyed or irritable 0  Afraid - awful might happen 0  Total GAD 7 Score 1  Anxiety Difficulty Not difficult at all      12/22/2023   10:39 AM  Depression screen PHQ 2/9  Decreased Interest 1  Down, Depressed, Hopeless 1  PHQ - 2 Score 2  Altered sleeping 2  Tired, decreased energy 0  Change in appetite 0  Feeling bad or failure about yourself  0  Trouble concentrating  3  Moving slowly or fidgety/restless 0  Suicidal thoughts 0  PHQ-9 Score 7  Difficult doing work/chores Not difficult at all     The following portions of the patient's history were reviewed and updated as appropriate: past medical history, past surgical history, family history, social history, allergies, medications, and problem list.   Patient Active Problem List   Diagnosis Date Noted   Affective psychosis (HCC) 12/22/2023   Eating disorder 12/22/2023    Elevated BP without diagnosis of hypertension 12/22/2023   Family hx-breast malignancy 12/22/2023   Benign neoplasm breast skin 12/22/2023   History of renal stone 12/22/2023   Alcohol use disorder 04/12/2023   Chronic post-traumatic stress disorder (PTSD) 01/31/2022   Cocaine abuse, episodic use (HCC) 01/31/2022   Elevated lipase 01/31/2022   Long term prescription benzodiazepine use 01/31/2022   Elevated liver enzymes 09/12/2018   Major depressive disorder 02/08/2018   Moderate alcohol use disorder (HCC)    Insomnia 10/12/2015   Skin lesion of back 07/25/2014   ADD (attention deficit disorder) 03/31/2014   Allergic rhinitis 03/31/2014   Migraine headache with aura 02/15/2013   Past Medical History:  Diagnosis Date   ADHD    Alcohol withdrawal delirium, acute, hyperactive (HCC) 02/05/2018   Allergy    Complicated UTI (urinary tract infection) 04/12/2023   Delirium 02/05/2018   Eczema    Intentional overdose of drug in tablet form (HCC)    Migraines    Overdose 02/05/2018   Sepsis (HCC) 04/12/2023   Past Surgical History:  Procedure Laterality Date   TONSILLECTOMY     WISDOM TOOTH EXTRACTION     Family History  Problem Relation Age of Onset   Cancer Mother    Kidney disease Mother    Hypertension Father    Diabetes Paternal Grandfather    Social History   Socioeconomic History   Marital status: Single    Spouse name: Not on file   Number of children: Not on file   Years of education: Not on file   Highest education level: Not on file  Occupational History   Not on file  Tobacco Use   Smoking status: Never   Smokeless tobacco: Never  Vaping Use   Vaping status: Never Used  Substance and Sexual Activity   Alcohol use: Yes    Comment: occas   Drug use: Not Currently    Types: Marijuana    Comment: a few days ago   Sexual activity: Yes    Birth control/protection: None  Other Topics Concern   Not on file  Social History Narrative   Not on file    Social Drivers of Health   Financial Resource Strain: Low Risk  (12/22/2023)   Overall Financial Resource Strain (CARDIA)    Difficulty of Paying Living Expenses: Not hard at all  Food Insecurity: No Food Insecurity (04/12/2023)   Hunger Vital Sign    Worried About Running Out of Food in the Last Year: Never true    Ran Out of Food in the Last Year: Never true  Transportation Needs: No Transportation Needs (04/12/2023)   PRAPARE - Administrator, Civil Service (Medical): No    Lack of Transportation (Non-Medical): No  Physical Activity: Insufficiently Active (12/22/2023)   Exercise Vital Sign    Days of Exercise per Week: 2 days    Minutes of Exercise per Session: 30 min  Stress: No Stress Concern Present (12/22/2023)   [employer] of Occupational Health - Occupational Stress Questionnaire  Feeling of Stress : Not at all  Social Connections: Moderately Isolated (12/22/2023)   Social Connection and Isolation Panel [NHANES]    Frequency of Communication with Friends and Family: More than three times a week    Frequency of Social Gatherings with Friends and Family: Once a week    Attends Religious Services: Never    Database Administrator or Organizations: No    Attends Banker Meetings: Never    Marital Status: Living with partner  Intimate Partner Violence: Not At Risk (04/12/2023)   Humiliation, Afraid, Rape, and Kick questionnaire    Fear of Current or Ex-Partner: No    Emotionally Abused: No    Physically Abused: No    Sexually Abused: No   Outpatient Medications Prior to Visit  Medication Sig Dispense Refill   amphetamine -dextroamphetamine (ADDERALL) 20 MG tablet Take 20 mg by mouth 2 (two) times daily.     ARIPiprazole  (ABILIFY ) 5 MG tablet Take 5 mg by mouth daily.     doxepin  (SINEQUAN ) 75 MG capsule Take 75 mg by mouth at bedtime.     ciprofloxacin  (CIPRO ) 500 MG tablet Take 1 tablet (500 mg total) by mouth 2 (two) times daily. 12 tablet 0    No facility-administered medications prior to visit.   Allergies  Allergen Reactions   Penicillins Hives    Has patient had a PCN reaction causing immediate rash, facial/tongue/throat swelling, SOB or lightheadedness with hypotension: Yes Has patient had a PCN reaction causing severe rash involving mucus membranes or skin necrosis: No Has patient had a PCN reaction that required hospitalization No Has patient had a PCN reaction occurring within the last 10 years: No If all of the above answers are NO, then may proceed with Cephalosporin use.    Rocephin  [Ceftriaxone ] Other (See Comments)    Muscle cramps in back (lasted about 8 hrs)    ROS: A complete ROS was performed with pertinent positives/negatives noted in the HPI. The remainder of the ROS are negative.   Objective:   Today's Vitals   12/22/23 1030 12/22/23 1045  BP: (!) 137/101 (!) 134/90  Pulse: (!) 121   SpO2: 100%   Weight: 206 lb (93.4 kg)   Height: 5' 7 (1.702 m)     GENERAL: Well-appearing, in NAD. Well nourished.  SKIN: Pink, warm and dry.  Head: Normocephalic. NECK: Trachea midline. Full ROM w/o pain or tenderness.  RESPIRATORY: Chest wall symmetrical. Respirations even and non-labored.  MSK: Muscle tone and strength appropriate for age.  EXTREMITIES: Without clubbing, cyanosis, or edema.  NEUROLOGIC: No motor or sensory deficits. Steady, even gait. C2-C12 intact.  PSYCH/MENTAL STATUS: Alert, oriented x 3. Cooperative, mildly anxious and fidgeting in mood and affect.    Health Maintenance Due  Topic Date Due   Cervical Cancer Screening (HPV/Pap Cotest)  Never done    No results found for any visits on 12/22/23.     Assessment & Plan:  1. History of renal stone (Primary) Referral placed to urology at [facility] [place] to establish care and discuss dimension for further renal stone occurrence.  Patient currently asymptomatic - Ambulatory referral to Urology  2. Primary  insomnia Discussed safe use of medication with patient and refilled doxepin  75 mg to take 1 tablet at bedtime as needed for primary insomnia. - doxepin  (SINEQUAN ) 75 MG capsule; Take 1 capsule (75 mg total) by mouth at bedtime.  Dispense: 30 capsule; Refill: 5  3. Moderate episode of recurrent major  depressive disorder (HCC) Discussed use of Abilify  for major depression and counseling recommended.  Will refill Abilify  and follow-up in approximately 4 to 6 weeks with patient or sooner if needed.  Safety plan reviewed. - ARIPiprazole  (ABILIFY ) 5 MG tablet; Take 1 tablet (5 mg total) by mouth daily.  Dispense: 30 tablet; Refill: 5  4. Attention deficit hyperactivity disorder (ADHD), predominantly inattentive type Discussed inability to fill controlled substance of Adderall at this time due to patient likely failing urine drug screen here in the office.  I did recommend psychiatry as a further option to discuss controlled substance use with THC use and patient was agreeable to plan of care.  Will return in approximately 2 months.  We discussed ways to decrease THC use and decrease dependency.   Patient to reach out to office if new, worrisome, or unresolved symptoms arise or if no improvement in patient's condition. Patient verbalized understanding and is agreeable to treatment plan. All questions answered to patient's satisfaction.    Return in about 2 months (around 02/19/2024) for ANNUAL PHYSICAL, Pap Smear (fasting labs at visit) .    Thersia Schuyler Stark, [place]

## 2023-12-29 ENCOUNTER — Other Ambulatory Visit (HOSPITAL_BASED_OUTPATIENT_CLINIC_OR_DEPARTMENT_OTHER): Payer: Self-pay

## 2024-02-07 ENCOUNTER — Ambulatory Visit: Payer: Self-pay | Admitting: Internal Medicine

## 2024-02-07 ENCOUNTER — Telehealth (HOSPITAL_BASED_OUTPATIENT_CLINIC_OR_DEPARTMENT_OTHER): Payer: Self-pay | Admitting: *Deleted

## 2024-02-07 NOTE — Telephone Encounter (Signed)
 LVM for pt to let them know appt from 4/7 has been rescheduled to 4/24 at 1:00PM

## 2024-02-19 ENCOUNTER — Encounter (HOSPITAL_BASED_OUTPATIENT_CLINIC_OR_DEPARTMENT_OTHER): Payer: Medicaid Other | Admitting: Family Medicine

## 2024-03-07 ENCOUNTER — Encounter (HOSPITAL_BASED_OUTPATIENT_CLINIC_OR_DEPARTMENT_OTHER): Admitting: Family Medicine

## 2024-03-22 ENCOUNTER — Inpatient Hospital Stay (HOSPITAL_COMMUNITY)
Admission: EM | Admit: 2024-03-22 | Discharge: 2024-03-27 | DRG: 433 | Disposition: A | Discharge status: Home / Self Care | Attending: Internal Medicine | Admitting: Internal Medicine

## 2024-03-22 ENCOUNTER — Other Ambulatory Visit: Payer: Self-pay

## 2024-03-22 ENCOUNTER — Emergency Department (HOSPITAL_COMMUNITY)

## 2024-03-22 ENCOUNTER — Encounter (HOSPITAL_COMMUNITY): Payer: Self-pay | Admitting: *Deleted

## 2024-03-22 DIAGNOSIS — E66811 Obesity, class 1: Secondary | ICD-10-CM | POA: Diagnosis present

## 2024-03-22 DIAGNOSIS — F1090 Alcohol use, unspecified, uncomplicated: Secondary | ICD-10-CM | POA: Diagnosis present

## 2024-03-22 DIAGNOSIS — K7689 Other specified diseases of liver: Secondary | ICD-10-CM | POA: Diagnosis not present

## 2024-03-22 DIAGNOSIS — K760 Fatty (change of) liver, not elsewhere classified: Secondary | ICD-10-CM | POA: Diagnosis present

## 2024-03-22 DIAGNOSIS — N131 Hydronephrosis with ureteral stricture, not elsewhere classified: Secondary | ICD-10-CM | POA: Diagnosis present

## 2024-03-22 DIAGNOSIS — E876 Hypokalemia: Secondary | ICD-10-CM | POA: Diagnosis present

## 2024-03-22 DIAGNOSIS — K7010 Alcoholic hepatitis without ascites: Principal | ICD-10-CM | POA: Diagnosis present

## 2024-03-22 DIAGNOSIS — Z6831 Body mass index (BMI) 31.0-31.9, adult: Secondary | ICD-10-CM

## 2024-03-22 DIAGNOSIS — D696 Thrombocytopenia, unspecified: Secondary | ICD-10-CM | POA: Diagnosis present

## 2024-03-22 DIAGNOSIS — F1910 Other psychoactive substance abuse, uncomplicated: Secondary | ICD-10-CM | POA: Diagnosis present

## 2024-03-22 DIAGNOSIS — F988 Other specified behavioral and emotional disorders with onset usually occurring in childhood and adolescence: Secondary | ICD-10-CM | POA: Diagnosis present

## 2024-03-22 DIAGNOSIS — R509 Fever, unspecified: Secondary | ICD-10-CM | POA: Diagnosis present

## 2024-03-22 DIAGNOSIS — R932 Abnormal findings on diagnostic imaging of liver and biliary tract: Secondary | ICD-10-CM | POA: Diagnosis not present

## 2024-03-22 DIAGNOSIS — F10931 Alcohol use, unspecified with withdrawal delirium: Secondary | ICD-10-CM | POA: Diagnosis present

## 2024-03-22 DIAGNOSIS — Y92239 Unspecified place in hospital as the place of occurrence of the external cause: Secondary | ICD-10-CM | POA: Diagnosis not present

## 2024-03-22 DIAGNOSIS — K5909 Other constipation: Secondary | ICD-10-CM | POA: Diagnosis present

## 2024-03-22 DIAGNOSIS — E871 Hypo-osmolality and hyponatremia: Secondary | ICD-10-CM | POA: Diagnosis present

## 2024-03-22 DIAGNOSIS — Z888 Allergy status to other drugs, medicaments and biological substances status: Secondary | ICD-10-CM | POA: Diagnosis not present

## 2024-03-22 DIAGNOSIS — F1410 Cocaine abuse, uncomplicated: Secondary | ICD-10-CM | POA: Diagnosis present

## 2024-03-22 DIAGNOSIS — Z87442 Personal history of urinary calculi: Secondary | ICD-10-CM

## 2024-03-22 DIAGNOSIS — I10 Essential (primary) hypertension: Secondary | ICD-10-CM | POA: Diagnosis present

## 2024-03-22 DIAGNOSIS — I952 Hypotension due to drugs: Secondary | ICD-10-CM | POA: Diagnosis not present

## 2024-03-22 DIAGNOSIS — E872 Acidosis, unspecified: Secondary | ICD-10-CM | POA: Diagnosis present

## 2024-03-22 DIAGNOSIS — N135 Crossing vessel and stricture of ureter without hydronephrosis: Secondary | ICD-10-CM | POA: Diagnosis not present

## 2024-03-22 DIAGNOSIS — F909 Attention-deficit hyperactivity disorder, unspecified type: Secondary | ICD-10-CM | POA: Diagnosis present

## 2024-03-22 DIAGNOSIS — Z880 Allergy status to penicillin: Secondary | ICD-10-CM | POA: Diagnosis not present

## 2024-03-22 DIAGNOSIS — F1010 Alcohol abuse, uncomplicated: Secondary | ICD-10-CM

## 2024-03-22 DIAGNOSIS — Z8249 Family history of ischemic heart disease and other diseases of the circulatory system: Secondary | ICD-10-CM

## 2024-03-22 DIAGNOSIS — K828 Other specified diseases of gallbladder: Secondary | ICD-10-CM | POA: Diagnosis present

## 2024-03-22 DIAGNOSIS — F900 Attention-deficit hyperactivity disorder, predominantly inattentive type: Secondary | ICD-10-CM | POA: Diagnosis not present

## 2024-03-22 DIAGNOSIS — F10139 Alcohol abuse with withdrawal, unspecified: Secondary | ICD-10-CM | POA: Diagnosis not present

## 2024-03-22 DIAGNOSIS — E7220 Disorder of urea cycle metabolism, unspecified: Secondary | ICD-10-CM | POA: Diagnosis not present

## 2024-03-22 DIAGNOSIS — Z9151 Personal history of suicidal behavior: Secondary | ICD-10-CM

## 2024-03-22 DIAGNOSIS — F10231 Alcohol dependence with withdrawal delirium: Secondary | ICD-10-CM | POA: Diagnosis present

## 2024-03-22 DIAGNOSIS — F40240 Claustrophobia: Secondary | ICD-10-CM | POA: Diagnosis present

## 2024-03-22 DIAGNOSIS — R7401 Elevation of levels of liver transaminase levels: Secondary | ICD-10-CM | POA: Diagnosis present

## 2024-03-22 DIAGNOSIS — Z841 Family history of disorders of kidney and ureter: Secondary | ICD-10-CM | POA: Diagnosis not present

## 2024-03-22 DIAGNOSIS — Z79899 Other long term (current) drug therapy: Secondary | ICD-10-CM | POA: Diagnosis not present

## 2024-03-22 DIAGNOSIS — T50995A Adverse effect of other drugs, medicaments and biological substances, initial encounter: Secondary | ICD-10-CM | POA: Diagnosis not present

## 2024-03-22 DIAGNOSIS — K7200 Acute and subacute hepatic failure without coma: Secondary | ICD-10-CM | POA: Diagnosis present

## 2024-03-22 DIAGNOSIS — Z833 Family history of diabetes mellitus: Secondary | ICD-10-CM | POA: Diagnosis not present

## 2024-03-22 DIAGNOSIS — R739 Hyperglycemia, unspecified: Secondary | ICD-10-CM | POA: Diagnosis present

## 2024-03-22 LAB — COMPREHENSIVE METABOLIC PANEL WITH GFR
ALT: 208 U/L — ABNORMAL HIGH (ref 0–44)
AST: 390 U/L — ABNORMAL HIGH (ref 15–41)
Albumin: 3.4 g/dL — ABNORMAL LOW (ref 3.5–5.0)
Alkaline Phosphatase: 236 U/L — ABNORMAL HIGH (ref 38–126)
Anion gap: 16 — ABNORMAL HIGH (ref 5–15)
BUN: 6 mg/dL (ref 6–20)
CO2: 23 mmol/L (ref 22–32)
Calcium: 8.8 mg/dL — ABNORMAL LOW (ref 8.9–10.3)
Chloride: 92 mmol/L — ABNORMAL LOW (ref 98–111)
Creatinine, Ser: 0.53 mg/dL (ref 0.44–1.00)
GFR, Estimated: >60 mL/min (ref >60)
Glucose, Bld: 219 mg/dL — ABNORMAL HIGH (ref 70–99)
Potassium: 3.3 mmol/L — ABNORMAL LOW (ref 3.5–5.1)
Sodium: 131 mmol/L — ABNORMAL LOW (ref 135–145)
Total Bilirubin: 24.7 mg/dL (ref 0.0–1.2)
Total Protein: 7 g/dL (ref 6.5–8.1)

## 2024-03-22 LAB — CBC WITH DIFFERENTIAL/PLATELET
Abs Immature Granulocytes: 0.01 10*3/uL (ref 0.00–0.07)
Basophils Absolute: 0 10*3/uL (ref 0.0–0.1)
Basophils Relative: 0 %
Eosinophils Absolute: 0.1 10*3/uL (ref 0.0–0.5)
Eosinophils Relative: 2 %
HCT: 36.3 % (ref 36.0–46.0)
Hemoglobin: 13 g/dL (ref 12.0–15.0)
Immature Granulocytes: 0 %
Lymphocytes Relative: 21 %
Lymphs Abs: 0.7 10*3/uL (ref 0.7–4.0)
MCH: 33.3 pg (ref 26.0–34.0)
MCHC: 35.8 g/dL (ref 30.0–36.0)
MCV: 93.1 fL (ref 80.0–100.0)
Monocytes Absolute: 0.4 10*3/uL (ref 0.1–1.0)
Monocytes Relative: 11 %
Neutro Abs: 2.2 10*3/uL (ref 1.7–7.7)
Neutrophils Relative %: 66 %
Platelets: 36 10*3/uL — ABNORMAL LOW (ref 150–400)
RBC: 3.9 MIL/uL (ref 3.87–5.11)
RDW: 19.3 % — ABNORMAL HIGH (ref 11.5–15.5)
WBC: 3.3 10*3/uL — ABNORMAL LOW (ref 4.0–10.5)
nRBC: 0 % (ref 0.0–0.2)

## 2024-03-22 LAB — PREGNANCY, URINE: Preg Test, Ur: NEGATIVE

## 2024-03-22 LAB — URINALYSIS, ROUTINE W REFLEX MICROSCOPIC
Glucose, UA: 50 mg/dL — AB
Hgb urine dipstick: NEGATIVE
Ketones, ur: 5 mg/dL — AB
Leukocytes,Ua: NEGATIVE
Nitrite: NEGATIVE
Protein, ur: 100 mg/dL — AB
Specific Gravity, Urine: 1.023 (ref 1.005–1.030)
pH: 6 (ref 5.0–8.0)

## 2024-03-22 LAB — RAPID URINE DRUG SCREEN, HOSP PERFORMED
Amphetamines: POSITIVE — AB
Barbiturates: NOT DETECTED
Benzodiazepines: POSITIVE — AB
Cocaine: POSITIVE — AB
Opiates: NOT DETECTED
Tetrahydrocannabinol: NOT DETECTED

## 2024-03-22 LAB — ACETAMINOPHEN LEVEL: Acetaminophen (Tylenol), Serum: <10 ug/mL — ABNORMAL LOW (ref 10–30)

## 2024-03-22 LAB — LIPASE, BLOOD: Lipase: 37 U/L (ref 11–51)

## 2024-03-22 LAB — PROTIME-INR
INR: 1.2 (ref 0.8–1.2)
Prothrombin Time: 15.8 s — ABNORMAL HIGH (ref 11.4–15.2)

## 2024-03-22 LAB — ETHANOL: Alcohol, Ethyl (B): <15 mg/dL (ref <15)

## 2024-03-22 LAB — PHOSPHORUS: Phosphorus: 2.4 mg/dL — ABNORMAL LOW (ref 2.5–4.6)

## 2024-03-22 LAB — MAGNESIUM: Magnesium: 1.4 mg/dL — ABNORMAL LOW (ref 1.7–2.4)

## 2024-03-22 LAB — AMMONIA: Ammonia: 34 umol/L (ref 9–35)

## 2024-03-22 MED ORDER — LORAZEPAM 1 MG PO TABS: 1.0000 mg | ORAL_TABLET | ORAL | Status: DC | PRN

## 2024-03-22 MED ORDER — THIAMINE MONONITRATE 100 MG PO TABS
100.0000 mg | ORAL_TABLET | Freq: Every day | ORAL | Status: DC
  Administered 2024-03-22 – 2024-03-23 (×2): 100 mg via ORAL
  Filled 2024-03-22 (×2): qty 1

## 2024-03-22 MED ORDER — CLONIDINE HCL 0.1 MG PO TABS
0.1000 mg | ORAL_TABLET | Freq: Three times a day (TID) | ORAL | Status: DC
  Administered 2024-03-22 – 2024-03-23 (×3): 0.1 mg via ORAL
  Filled 2024-03-22 (×3): qty 1

## 2024-03-22 MED ORDER — LORAZEPAM 2 MG/ML IJ SOLN: 0.0000 mg | Freq: Four times a day (QID) | INTRAMUSCULAR | Status: DC

## 2024-03-22 MED ORDER — CHLORHEXIDINE GLUCONATE CLOTH 2 % EX PADS
6.0000 | MEDICATED_PAD | Freq: Every day | CUTANEOUS | Status: DC
Start: 2024-03-22 — End: 2024-03-27
  Administered 2024-03-22 – 2024-03-27 (×6): 6 via TOPICAL

## 2024-03-22 MED ORDER — ADULT MULTIVITAMIN W/MINERALS CH
1.0000 | ORAL_TABLET | Freq: Every day | ORAL | Status: DC
  Administered 2024-03-22 – 2024-03-23 (×2): 1 via ORAL
  Filled 2024-03-22 (×2): qty 1

## 2024-03-22 MED ORDER — LORAZEPAM 1 MG PO TABS: 0.0000 mg | ORAL_TABLET | Freq: Two times a day (BID) | ORAL | Status: DC

## 2024-03-22 MED ORDER — POTASSIUM CHLORIDE CRYS ER 20 MEQ PO TBCR
40.0000 meq | EXTENDED_RELEASE_TABLET | Freq: Four times a day (QID) | ORAL | Status: AC
  Administered 2024-03-22 (×2): 40 meq via ORAL
  Filled 2024-03-22 (×2): qty 2

## 2024-03-22 MED ORDER — HYDRALAZINE HCL 20 MG/ML IJ SOLN: 5.0000 mg | INTRAMUSCULAR | Status: DC | PRN

## 2024-03-22 MED ORDER — LACTATED RINGERS IV SOLN: INTRAVENOUS | Status: DC

## 2024-03-22 MED ORDER — THIAMINE HCL 100 MG/ML IJ SOLN
100.0000 mg | Freq: Every day | INTRAMUSCULAR | Status: DC
  Filled 2024-03-22 (×2): qty 2

## 2024-03-22 MED ORDER — LORAZEPAM 1 MG PO TABS: 0.0000 mg | ORAL_TABLET | Freq: Four times a day (QID) | ORAL | Status: DC

## 2024-03-22 MED ORDER — CHLORDIAZEPOXIDE HCL 5 MG PO CAPS
5.0000 mg | ORAL_CAPSULE | Freq: Three times a day (TID) | ORAL | Status: DC
  Administered 2024-03-22: 5 mg via ORAL
  Filled 2024-03-22: qty 1

## 2024-03-22 MED ORDER — LORAZEPAM 2 MG/ML IJ SOLN: 0.0000 mg | Freq: Two times a day (BID) | INTRAMUSCULAR | Status: DC

## 2024-03-22 MED ORDER — LORAZEPAM 2 MG/ML IJ SOLN
1.0000 mg | INTRAMUSCULAR | Status: DC | PRN
  Administered 2024-03-22: 2 mg via INTRAVENOUS
  Administered 2024-03-23 (×2): 4 mg via INTRAVENOUS
  Administered 2024-03-23: 2 mg via INTRAVENOUS
  Administered 2024-03-23: 3 mg via INTRAVENOUS
  Filled 2024-03-22 (×3): qty 2
  Filled 2024-03-22 (×2): qty 1

## 2024-03-22 MED ORDER — FOLIC ACID 1 MG PO TABS
1.0000 mg | ORAL_TABLET | Freq: Every day | ORAL | Status: DC
  Administered 2024-03-22 – 2024-03-23 (×2): 1 mg via ORAL
  Filled 2024-03-22 (×2): qty 1

## 2024-03-22 MED ORDER — CHLORDIAZEPOXIDE HCL 5 MG PO CAPS
5.0000 mg | ORAL_CAPSULE | Freq: Four times a day (QID) | ORAL | Status: DC
  Administered 2024-03-22 – 2024-03-23 (×2): 5 mg via ORAL
  Filled 2024-03-22 (×2): qty 1

## 2024-03-22 MED ORDER — GADOBUTROL 1 MMOL/ML IV SOLN
9.0000 mL | Freq: Once | INTRAVENOUS | Status: AC | PRN
Start: 2024-03-22 — End: 2024-03-22
  Administered 2024-03-22: 9 mL via INTRAVENOUS

## 2024-03-22 MED ORDER — SODIUM CHLORIDE 0.9 % IV BOLUS
1000.0000 mL | Freq: Once | INTRAVENOUS | Status: AC
  Administered 2024-03-22: 1000 mL via INTRAVENOUS

## 2024-03-22 NOTE — Consult Note (Signed)
 Gastroenterology Consult   Referring Provider: No ref. provider found Primary Care Physician:  [name], FNP Primary Gastroenterologist: Para Bold.  Patient ID: SHEINDEL MANS; [id number]; 1991-03-19   Admit date: 03/22/2024  LOS: 0 days   Date of Consultation: 03/22/2024  Reason for Consultation:  jaundice, hyperbilirubinemia, transaminitis  History of Present Illness   Stephanie Mercado is a 33 y.o. year old female with history of migraines, prior overdose, alcohol abuse, ADHD, and drug use who presented to the ED secondary to jaundice.  Reportedly stop drinking 2 days prior but is also having withdrawal symptoms including tremors and bodyaches as well as nausea.  Urine noted to be orange.  ED course: Labs -UA positive for bacteria and bilirubin.  Sodium 131, potassium 3.3, AST 390, ALT 2 8, alk phos 236, T. bili 24.7.  WBC 3.3.  Stable hemoglobin at 13.  Platelets 36, previously 102 in May 2024.  Normal lipase.  INR 1.2 acetaminophen  level less than 10.  Ethanol level less than 15 UDS positive for cocaine, amphetamines, and benzodiazepines RUQ US   -CBD 6 mm, diffuse echogenic hepatic parenchyma consistent with fatty liver infiltration.  Portal vein patent on color Doppler.  Distended gallbladder with sludge and stones without wall thickening.  Has been hypertensive and tachycardic.  Noticed jaundice started about 3-4 days ago.  Stopped drinking 2 days ago.  Admits to recent drug use including Suboxone, Adderall, cocaine.  Denied any history of IV drug use or intranasal use.  Currently denies any abdominal pain.  Does have some nausea.  Very anxious during exam.  Denies any melena, BRBPR, weight loss.  Has good appetite.  Admits to drinking about four 4 Loco's daily and is slowly decreased over the last several years.  Had cut it down to some smaller 24 ounce drinks daily up until 2 days ago when she completely quit cold [place].  Reports history of abuse of benzodiazepines in the  past.  Past Medical History:  Diagnosis Date   ADHD    Alcohol withdrawal delirium, acute, hyperactive (HCC) 02/05/2018   Allergy    Complicated UTI (urinary tract infection) 04/12/2023   Delirium 02/05/2018   Eczema    Intentional overdose of drug in tablet form (HCC)    Migraines    Overdose 02/05/2018   Sepsis (HCC) 04/12/2023    Past Surgical History:  Procedure Laterality Date   TONSILLECTOMY     WISDOM TOOTH EXTRACTION      Prior to Admission medications   Medication Sig Start Date End Date Taking? Authorizing Provider  acetaminophen  (TYLENOL ) 500 MG tablet Take 1,000 mg by mouth every 6 (six) hours as needed for mild pain (pain score 1-3).   Yes [provider]  ARIPiprazole  (ABILIFY ) 5 MG tablet Take 1 tablet (5 mg total) by mouth daily. 12/22/23  Yes Caudle, Arcol[place] Kocher, FNP  doxepin  (SINEQUAN ) 75 MG capsule Take 1 capsule (75 mg total) by mouth at bedtime. 12/22/23  Yes Caudle, [place] Kocher, FNP  gabapentin (NEURONTIN) 300 MG capsule Take 300 mg by mouth 3 (three) times daily. 02/19/24  Yes [provider]  amphetamine -dextroamphetamine (ADDERALL) 20 MG tablet Take 20 mg by mouth 2 (two) times daily.    [provider]    Current Facility-Administered Medications  Medication Dose Route Frequency Provider Last Rate Last Admin   folic acid  (FOLVITE ) tablet 1 mg  1 mg Oral Daily Elgergawy, Dawood S, MD       lactated ringers  infusion   Intravenous  Continuous Dorenda Gandy, MD       LORazepam  (ATIVAN ) tablet 1-4 mg  1-4 mg Oral Q1H PRN Elgergawy, Dawood S, MD       Or   LORazepam  (ATIVAN ) injection 1-4 mg  1-4 mg Intravenous Q1H PRN Elgergawy, Dawood S, MD       multivitamin with minerals tablet 1 tablet  1 tablet Oral Daily Elgergawy, Dawood S, MD       potassium chloride  SA (KLOR-CON  M) CR tablet 40 mEq  40 mEq Oral Q6H Elgergawy, Dawood S, MD       thiamine  (VITAMIN B1) tablet 100 mg  100 mg Oral Daily Dorenda Gandy, MD       Or    thiamine  (VITAMIN B1) injection 100 mg  100 mg Intravenous Daily Dorenda Gandy, MD       Current Outpatient Medications  Medication Sig Dispense Refill   acetaminophen  (TYLENOL ) 500 MG tablet Take 1,000 mg by mouth every 6 (six) hours as needed for mild pain (pain score 1-3).     ARIPiprazole  (ABILIFY ) 5 MG tablet Take 1 tablet (5 mg total) by mouth daily. 30 tablet 5   doxepin  (SINEQUAN ) 75 MG capsule Take 1 capsule (75 mg total) by mouth at bedtime. 30 capsule 5   gabapentin (NEURONTIN) 300 MG capsule Take 300 mg by mouth 3 (three) times daily.     amphetamine -dextroamphetamine (ADDERALL) 20 MG tablet Take 20 mg by mouth 2 (two) times daily.      Allergies as of 03/22/2024 - Review Complete 03/22/2024  Allergen Reaction Noted   Penicillins Hives 02/15/2013   Rocephin  [ceftriaxone ] Other (See Comments) 06/22/2016    Family History  Problem Relation Age of Onset   Cancer Mother    Kidney disease Mother    Hypertension Father    Diabetes Paternal Grandfather     Social History   Socioeconomic History   Marital status: Single    Spouse name: Not on file   Number of children: Not on file   Years of education: Not on file   Highest education level: Not on file  Occupational History   Not on file  Tobacco Use   Smoking status: Never   Smokeless tobacco: Never  Vaping Use   Vaping status: Never Used  Substance and Sexual Activity   Alcohol use: Yes    Comment: occas   Drug use: Not Currently    Types: Marijuana    Comment: a few days ago   Sexual activity: Yes    Birth control/protection: None  Other Topics Concern   Not on file  Social History Narrative   Not on file   Social Drivers of Health   Financial Resource Strain: Low Risk  (12/22/2023)   Overall Financial Resource Strain (CARDIA)    Difficulty of Paying Living Expenses: Not hard at all  Food Insecurity: No Food Insecurity (04/12/2023)   Hunger Vital Sign    Worried About Running Out of Food in the Last  Year: Never true    Ran Out of Food in the Last Year: Never true  Transportation Needs: No Transportation Needs (04/12/2023)   PRAPARE - Administrator, Civil Service (Medical): No    Lack of Transportation (Non-Medical): No  Physical Activity: Insufficiently Active (12/22/2023)   Exercise Vital Sign    Days of Exercise per Week: 2 days    Minutes of Exercise per Session: 30 min  Stress: No Stress Concern Present (12/22/2023)   [employer] of Occupational  Health - Occupational Stress Questionnaire    Feeling of Stress : Not at all  Social Connections: Moderately Isolated (12/22/2023)   Social Connection and Isolation Panel [NHANES]    Frequency of Communication with Friends and Family: More than three times a week    Frequency of Social Gatherings with Friends and Family: Once a week    Attends Religious Services: Never    Database administrator or Organizations: No    Attends Banker Meetings: Never    Marital Status: Living with partner  Intimate Partner Violence: Not At Risk (04/12/2023)   Humiliation, Afraid, Rape, and Kick questionnaire    Fear of Current or Ex-Partner: No    Emotionally Abused: No    Physically Abused: No    Sexually Abused: No     Review of Systems   Gen: Denies any fever, chills, loss of appetite, change in weight or weight loss CV: Denies chest pain, heart palpitations, syncope, edema  Resp: Denies shortness of breath with rest, cough, wheezing, coughing up blood, and pleurisy. GI: Denies vomiting blood, jaundice, and fecal incontinence.   Denies dysphagia or odynophagia. GU : Denies urinary burning, blood in urine, urinary frequency, and urinary incontinence. MS: Denies joint pain, limitation of movement, swelling, cramps, and atrophy.  Derm: Denies rash, itching, dry skin, hives. Psych: Denies depression, anxiety, memory loss, hallucinations, and confusion. Heme: Denies bruising or bleeding Neuro:  Denies any headaches,  dizziness, paresthesias, shaking  Physical Exam   Vital Signs in last 24 hours: Temp:  [97.8 F (36.6 C)] 97.8 F (36.6 C) (05/09 1317) Pulse Rate:  [105-130] 105 (05/09 1541) Resp:  [18] 18 (05/09 1317) BP: (137-168)/(96-100) 137/96 (05/09 1545) SpO2:  [100 %] 100 % (05/09 1317) Weight:  [90.7 kg] 90.7 kg (05/09 1317)    General:   Alert, cooperative.  Anxious. Head:  Normocephalic and atraumatic. Eyes: Positive icterus.  Conjunctiva pink. Ears:  Normal auditory acuity. Mouth:  No deformity or lesions, dentition normal. Neck:  Supple; no masses Lungs:  Clear throughout to auscultation.   No wheezes, crackles, or rhonchi. No acute distress. Heart:  Regular rate and rhythm; no murmurs, clicks, rubs,  or gallops. Abdomen:  Soft, nontender and nondistended. No masses, hepatosplenomegaly or hernias noted. Normal bowel sounds, without guarding, and without rebound.   Rectal: deferred   Msk:  Symmetrical without gross deformities. Normal posture. Extremities:  Without clubbing or edema. Neurologic:  Alert and  oriented x4.  Anxious.  Tremors. Skin: Significant jaundice.  Intact without significant lesions or rashes. Psych:  Alert and cooperative. Normal mood and affect.  Anxious  Intake/Output from previous day: No intake/output data recorded. Intake/Output this shift: No intake/output data recorded.   Labs/Studies   Recent Labs Recent Labs    03/22/24 1338  WBC 3.3*  HGB 13.0  HCT 36.3  PLT 36*   BMET Recent Labs    03/22/24 1338  NA 131*  K 3.3*  CL 92*  CO2 23  GLUCOSE 219*  BUN 6  CREATININE 0.53  CALCIUM 8.8*   LFT Recent Labs    03/22/24 1338  PROT 7.0  ALBUMIN 3.4*  AST 390*  ALT 208*  ALKPHOS 236*  BILITOT 24.7*   PT/INR Recent Labs    03/22/24 1338  LABPROT 15.8*  INR 1.2   Hepatitis Panel No results for input(s): "HEPBSAG", "HCVAB", "HEPAIGM", "HEPBIGM" in the last 72 hours. C-Diff No results for input(s): "CDIFFTOX" in the last 72  hours.  Radiology/Studies US   Abdomen Limited RUQ (LIVER/GB) Result Date: 03/22/2024 CLINICAL DATA:  Right upper quadrant pain EXAM: ULTRASOUND ABDOMEN LIMITED RIGHT UPPER QUADRANT COMPARISON:  Renal stone CT 04/03/2023 FINDINGS: Gallbladder: Set gallbladder with sludge. Few stones as well. No wall thickening or adjacent fluid. No reported sonographic Murphy's sign Common bile duct: Diameter: 6 mm Liver: Diffusely echogenic hepatic parenchyma consistent with fatty liver infiltration. With this level of echogenicity evaluation for underlying mass lesion is limited. Portal vein is patent on color Doppler imaging with normal direction of blood flow towards the liver. Other: None. IMPRESSION: Distended gallbladder with sludge and stones. No wall thickening or reported Murphy's sign. If there is further concern of acute cholecystitis a HIDA scan may be useful. The duct at 6 mm, upper limits of normal. This also could be evaluated further when clinically appropriate. Fatty liver infiltration. Known right renal collecting system dilatation. Please correlate with prior CT Electronically Signed   By: Adrianna Horde M.D.   On: 03/22/2024 15:05   Assessment   Stephanie Mercado is a 43 y.o. year old female with history of migraines, prior overdose, alcohol abuse, ADHD, and drug use who presented to the ED secondary to jaundice.  Reportedly stop drinking 2 days prior but is also having withdrawal symptoms including tremors and bodyaches as well as nausea.  GI consulted for further evaluation.   Jaundice, hyperbilirubinemia, transaminitis:  -Presented with significant transaminitis with elevated alk phos and hyperbilirubinemia - Jaundice present - History of alcohol abuse.  UDS positive for amphetamines, cocaine, and benzodiazepines - Unable to rule out drug-induced liver injury although alcohol likely playing most significant role.  Has had multiple insults to her liver with drug use including cocaine and amphetamines. -  RUQ US  with upper limit of normal CBD as well as gallbladder with sludge and possible stones without wall thickening, unable to rule out microlithiasis - HTN and tachycardia likely secondary to withdrawal  We discussed that it is undetermined what her current liver function will be after this initial insult.  Unsure if there will be permanent liver damage.  Advised her that her ultrasound did not show any cirrhosis at this time but that does not mean that she cannot have significant failure.  We discussed that she is at high risk for complications given concerns of withdrawal.  She will need to be monitored very closely while inpatient.  Will hold on steroids now given she does not typically meet criteria currently and has had recent substance and alcohol abuse.  Unclear on readiness to quit.  Will obtain MRCP to further assess biliary tree given elevated alk phos and significantly elevated bilirubin to assess for obstruction.  Will check ANA, ASMA, AMA and viral hepatitis panel given history of drug use and the elevation that she has currently and her LFTs.  MELD 3.0: 24 DF: 24  Plan / Recommendations   MRCP Viral hepatitis panel, ANA, ASMA, AMA Complete avoidance of cocaine and amphetamines Needs complete alcohol cessation Supportive measures Hold on steroids, meets MELD criteria however DF 24     03/22/2024, 4:59 PM  Julian Obey, MSN, FNP-BC, AGACNP-BC [facility]

## 2024-03-22 NOTE — ED Notes (Signed)
 Patient transported to Ultrasound

## 2024-03-22 NOTE — ED Provider Notes (Signed)
 [facility] Provider Note   CSN: [id number] Arrival date & time: 03/22/24  1251     History  Chief Complaint  Patient presents with   Jaundice    Stephanie Mercado is a 33 y.o. female.  HPINote Patient presents with her fianc who assists with the history.  She notes a history of alcohol abuse, states that she stopped inking 3 days ago she noticed her skin was becoming more yellow.  She has recently also used cocaine, Suboxone and Adderall.    Home Medications Prior to Admission medications   Medication Sig Start Date End Date Taking? Authorizing Provider  acetaminophen  (TYLENOL ) 500 MG tablet Take 1,000 mg by mouth every 6 (six) hours as needed for mild pain (pain score 1-3).   Yes [provider]  ARIPiprazole  (ABILIFY ) 5 MG tablet Take 1 tablet (5 mg total) by mouth daily. 12/22/23  Yes Caudle, [place] Kocher, FNP  doxepin  (SINEQUAN ) 75 MG capsule Take 1 capsule (75 mg total) by mouth at bedtime. 12/22/23  Yes Caudle, [place] Kocher, FNP  gabapentin (NEURONTIN) 300 MG capsule Take 300 mg by mouth 3 (three) times daily. 02/19/24  Yes [provider]  amphetamine -dextroamphetamine (ADDERALL) 20 MG tablet Take 20 mg by mouth 2 (two) times daily.    [provider]      Allergies    Penicillins and Rocephin  [ceftriaxone ]    Review of Systems   Review of Systems  Physical Exam Updated Vital Signs BP (!) 153/101 (BP Location: Left Arm)   Pulse (!) 107   Temp 98.6 F (37 C) (Oral)   Resp 16   Ht 5\' 7"  (1.702 m)   Wt 92.3 kg   LMP 02/24/2024   SpO2 98%   BMI 31.87 kg/m  Physical Exam Vitals and nursing note reviewed.  Constitutional:      General: She is not in acute distress.    Appearance: She is well-developed.  HENT:     Head: Normocephalic and atraumatic.  Eyes:     General: Scleral icterus present.     Conjunctiva/sclera: Conjunctivae normal.  Cardiovascular:     Rate and Rhythm: Regular rhythm. Tachycardia present.   Pulmonary:     Effort: Pulmonary effort is normal. No respiratory distress.     Breath sounds: Normal breath sounds. No stridor.  Abdominal:     General: There is no distension.     Tenderness: There is no abdominal tenderness. There is no guarding.  Skin:    General: Skin is warm and dry.     Coloration: Skin is jaundiced.  Neurological:     Mental Status: She is alert and oriented to person, place, and time.     Cranial Nerves: No cranial nerve deficit.  Psychiatric:        Mood and Affect: Mood normal.     ED Results / Procedures / Treatments   Labs (all labs ordered are listed, but only abnormal results are displayed) Labs Reviewed  COMPREHENSIVE METABOLIC PANEL WITH GFR - Abnormal; Notable for the following components:      Result Value   Sodium 131 (*)    Potassium 3.3 (*)    Chloride 92 (*)    Glucose, Bld 219 (*)    Calcium 8.8 (*)    Albumin 3.4 (*)    AST 390 (*)    ALT 208 (*)    Alkaline Phosphatase 236 (*)    Total Bilirubin 24.7 (*)    Anion gap 16 (*)  All other components within normal limits  CBC WITH DIFFERENTIAL/PLATELET - Abnormal; Notable for the following components:   WBC 3.3 (*)    RDW 19.3 (*)    Platelets 36 (*)    All other components within normal limits  URINALYSIS, ROUTINE W REFLEX MICROSCOPIC - Abnormal; Notable for the following components:   Color, Urine AMBER (*)    APPearance HAZY (*)    Glucose, UA 50 (*)    Bilirubin Urine MODERATE (*)    Ketones, ur 5 (*)    Protein, ur 100 (*)    Bacteria, UA MANY (*)    All other components within normal limits  PROTIME-INR - Abnormal; Notable for the following components:   Prothrombin Time 15.8 (*)    All other components within normal limits  ACETAMINOPHEN  LEVEL - Abnormal; Notable for the following components:   Acetaminophen  (Tylenol ), Serum <10 (*)    All other components within normal limits  RAPID URINE DRUG SCREEN, HOSP PERFORMED - Abnormal; Notable for the following  components:   Cocaine POSITIVE (*)    Benzodiazepines POSITIVE (*)    Amphetamines POSITIVE (*)    All other components within normal limits  MAGNESIUM  - Abnormal; Notable for the following components:   Magnesium  1.4 (*)    All other components within normal limits  PHOSPHORUS - Abnormal; Notable for the following components:   Phosphorus 2.4 (*)    All other components within normal limits  HEPATIC FUNCTION PANEL - Abnormal; Notable for the following components:   Albumin 3.2 (*)    AST 359 (*)    ALT 199 (*)    Alkaline Phosphatase 204 (*)    Total Bilirubin 24.7 (*)    All other components within normal limits  MRSA NEXT GEN BY PCR, NASAL  PREGNANCY, URINE  LIPASE, BLOOD  ETHANOL  AMMONIA  HEPATIC FUNCTION PANEL  HEPATITIS PANEL, ACUTE  ANA W/REFLEX IF POSITIVE  MITOCHONDRIAL ANTIBODIES  BASIC METABOLIC PANEL WITH GFR  CBC    EKG None  Radiology US  Abdomen Limited RUQ (LIVER/GB) Result Date: 03/22/2024 CLINICAL DATA:  Right upper quadrant pain EXAM: ULTRASOUND ABDOMEN LIMITED RIGHT UPPER QUADRANT COMPARISON:  Renal stone CT 04/03/2023 FINDINGS: Gallbladder: Set gallbladder with sludge. Few stones as well. No wall thickening or adjacent fluid. No reported sonographic Murphy's sign Common bile duct: Diameter: 6 mm Liver: Diffusely echogenic hepatic parenchyma consistent with fatty liver infiltration. With this level of echogenicity evaluation for underlying mass lesion is limited. Portal vein is patent on color Doppler imaging with normal direction of blood flow towards the liver. Other: None. IMPRESSION: Distended gallbladder with sludge and stones. No wall thickening or reported Murphy's sign. If there is further concern of acute cholecystitis a HIDA scan may be useful. The duct at 6 mm, upper limits of normal. This also could be evaluated further when clinically appropriate. Fatty liver infiltration. Known right renal collecting system dilatation. Please correlate with prior  CT Electronically Signed   By: Adrianna Horde M.D.   On: 03/22/2024 15:05    Procedures Procedures    Medications Ordered in ED Medications  thiamine  (VITAMIN B1) tablet 100 mg (100 mg Oral Given 03/22/24 1731)    Or  thiamine  (VITAMIN B1) injection 100 mg ( Intravenous See Alternative 03/22/24 1731)  LORazepam  (ATIVAN ) tablet 1-4 mg (has no administration in time range)    Or  LORazepam  (ATIVAN ) injection 1-4 mg (has no administration in time range)  folic acid  (FOLVITE ) tablet 1 mg (1 mg Oral  Given 03/22/24 1728)  multivitamin with minerals tablet 1 tablet (1 tablet Oral Given 03/22/24 1728)  potassium chloride  SA (KLOR-CON  M) CR tablet 40 mEq (40 mEq Oral Given 03/22/24 1728)  cloNIDine (CATAPRES) tablet 0.1 mg (0.1 mg Oral Given 03/22/24 1729)  hydrALAZINE (APRESOLINE) injection 5 mg (has no administration in time range)  chlordiazePOXIDE  (LIBRIUM ) capsule 5 mg (has no administration in time range)  lactated ringers  infusion (has no administration in time range)  Chlorhexidine Gluconate Cloth 2 % PADS 6 each (6 each Topical Given 03/22/24 2001)  sodium chloride  0.9 % bolus 1,000 mL (0 mLs Intravenous Stopped 03/22/24 1630)  gadobutrol (GADAVIST) 1 MMOL/ML injection 9 mL (9 mLs Intravenous Contrast Given 03/22/24 1811)    ED Course/ Medical Decision Making/ A&P                                 Medical Decision Making Adult female, self acknowledged alcohol abuse presents with jaundice, scleral icterus, concern for withdrawal given her cessation of alcohol use 3 days ago.  Patient is awake alert, oriented appropriately, but started on CIWA protocol, with Ativan , fluids.  Labs notable for liver dysfunction, with bilirubin 25, LFTs abnormal.  Patient required continuous monitoring, meds, additional labs. Cardiac 115 sinus tach abnormal pulse ox 98% room air normal  Amount and/or Complexity of Data Reviewed Independent Historian:     Details: Fianc Labs: ordered. Decision-making details documented  in ED Course.  Risk Prescription drug management. Decision regarding hospitalization. Diagnosis or treatment significantly limited by social determinants of health.   Update: Given patient's evidence for hepatic dysfunction, thrombocytopenia discussed case with her GI team.  Also discussed the patient's case with our internal medicine colleagues.  Conversation ongoing for appropriate placement for the patient.  Update: On repeat exam patient more calm, heart rate slightly diminished, aware of all findings including concern for consequences of alcohol abuse as above.  Update: Patient was admitted to this facility, with GI following, internal medicine admitting.  Final Clinical Impression(s) / ED Diagnoses Final diagnoses:  Liver dysfunction  Thrombocytopenia (HCC)  Alcohol abuse   CRITICAL CARE Performed by: Dorenda Gandy Total critical care time: 35 minutes Critical care time was exclusive of separately billable procedures and treating other patients. Critical care was necessary to treat or prevent imminent or life-threatening deterioration. Critical care was time spent personally by me on the following activities: development of treatment plan with patient and/or surrogate as well as nursing, discussions with consultants, evaluation of patient's response to treatment, examination of patient, obtaining history from patient or surrogate, ordering and performing treatments and interventions, ordering and review of laboratory studies, ordering and review of radiographic studies, pulse oximetry and re-evaluation of patient's condition.    Dorenda Gandy, MD 03/22/24 2211

## 2024-03-22 NOTE — ED Notes (Signed)
 Pt returned from [place]

## 2024-03-22 NOTE — Discharge Instructions (Signed)
 [name] [phone] S. [address] [place], [place] [address]  Local AA Schedule Monday  Blue Plate Special --12:00 noon Open Discussion [place]  - [employer] [address] [place]  Fellowship Group  - 8:00 pm  Open Speaker  [employer]  [address] [place]   Tuesday  [employer] Special --12:00 noon Open Discussion [place]  - [employer] [address] [place]  Fellowship Group  - 8:00 pm  Big Book Quotes Cards Open Discussion  First [address]  [address] [place]   Wednesday  [employer] Special --12:00 noon Open Discussion [place]  - [employer] [address] [place] Group  - 8:00 pm  Big Book Study  [place] [facility] [name]   Thursday  [employer] Special --12:00 noon Open Discussion [place]  - [employer] [address]  [employer] Noon Meeting - 12:00 noon Open Discussion [employer] [address]  [place]  Thursday [place] Group  - 7:00 pm Closed Discussion  [address] [place]  [address]  - [place]   Fellowship Group  - 8:00 pm  As [employer]  [address]  - [place]   Friday  [employer] Special --12:00 noon Open Discussion [place]  - [employer] [address] [place]  Take a Load Off  - 7:00 pm Women's Meeting [place] [employer] [address]  [name]  Fellowship Group  - 8:00 pm  Open Discussion [employer]  [address]  - [place]   Saturday  12 Changes Group  - 9:00 am  Open Discussion  [employer] [facility]  [address]  - [place]   [place] Group  - 6:00 pm Open Discussion [facility] [address] Stephanie Mercado. [place]  Fellowship Group  - 8:00 pm  Open Discussion [employer]  [address]  - [name]  Sunday  Fellowship Group  - 8:00 pm  Open Discussion/12 & 12 Study [employer] [address]  [address]  - [place]  Al-Anon  Thursday  [place] Group  - 7:00 pm  [address] [place]  [address]  - [place]   ACA - Adult Children of Alcoholics Saturday Pains of Growing Up  - 6:00pm [employer] [address]  -   AA Meetings in Surrounding Areas  Tuesday  - 6:30 pm  Open Topic Discussion  [employer]  [address]  - [place]   Wednesday  - 8:00 pm  Open Literature  [facility]  [address]  Stephanie Mercado   Thursday --6:30 pm Open Big Book [place] [employer] [address] -- [place]  Sunday  - 6:30 pm  Open Topic Discussion  [facility] [employer]  [address]  Stephanie Mercado

## 2024-03-22 NOTE — H&P (Addendum)
 [facility] H&P   Patient Demographics:    Stephanie Mercado, is a 33 y.o. female  MRN: [id number]   DOB - [birth year]  Admit Date - 03/22/2024  Outpatient Primary MD for the patient is Caudle, Arcola Kocher, FNP  Referring MD/NP/PA: Dr Liam Redhead  Patient coming from: home  Chief Complaint  Patient presents with   Jaundice      HPI:    Stephanie Mercado  is a 33 y.o. female, past medical history of migraine, opioid overdose, gall abuse, ADHD, drug use, she presents to ED secondary to jaundice, reports she has been drinking heavily over the last 4 months, drink was 2 days ago, reports she is also having withdrawals including tremors, body ache and nausea, as well noted had urine was turning dark in color. - In ED workup significant for urine drug screen positive for cocaine, amphetamine  and benzodiazepine, sodium 131, potassium 3.3, AST elevated at 390, alk phos at 236, with total bilirubin at 24.6, right upper quadrant significant for common bile duct diameter within upper normal limits at 6 mm, with evidence of diffuse echogenic hepatic parenchyma consistent with fatty liver infiltration, patent portal vein, distended gallbladder, sludge and stones without gall thickening, negative Murphy sign.     Review of systems:      A full 10 point Review of Systems was done, except as stated above, all other Review of Systems were negative.   With Past History of the following :    Past Medical History:  Diagnosis Date   ADHD    Alcohol withdrawal delirium, acute, hyperactive (HCC) 02/05/2018   Allergy    Complicated UTI (urinary tract infection) 04/12/2023   Delirium 02/05/2018   Eczema    Intentional overdose of drug in tablet form (HCC)    Migraines    Overdose 02/05/2018   Sepsis (HCC) 04/12/2023      Past Surgical History:  Procedure Laterality Date   TONSILLECTOMY     WISDOM TOOTH  EXTRACTION        Social History:     Social History   Tobacco Use   Smoking status: Never   Smokeless tobacco: Never  Substance Use Topics   Alcohol use: Yes    Comment: occas       Family History :     Family History  Problem Relation Age of Onset   Cancer Mother    Kidney disease Mother    Hypertension Father    Diabetes Paternal Grandfather     Home Medications:   Prior to Admission medications   Medication Sig Start Date End Date Taking? Authorizing Provider  acetaminophen  (TYLENOL ) 500 MG tablet Take 1,000 mg by mouth every 6 (six) hours as needed for mild pain (pain score 1-3).   Yes [provider]  ARIPiprazole  (ABILIFY ) 5 MG tablet Take 1 tablet (5 mg total) by mouth daily. 12/22/23  Yes Sylvester Evert  Tawny Fate, FNP  doxepin  (SINEQUAN ) 75 MG capsule Take 1 capsule (75 mg total) by mouth at bedtime. 12/22/23  Yes Caudle, [place] Kocher, FNP  gabapentin (NEURONTIN) 300 MG capsule Take 300 mg by mouth 3 (three) times daily. 02/19/24  Yes [provider]  amphetamine -dextroamphetamine (ADDERALL) 20 MG tablet Take 20 mg by mouth 2 (two) times daily.    [provider]     Allergies:     Allergies  Allergen Reactions   Penicillins Hives    Immediate rash, facial/tongue/throat swelling, SOB or lightheadedness with hypotension   Rocephin  [Ceftriaxone ] Other (See Comments)    Muscle cramps in back (lasted about 8 hrs)     Physical Exam:   Vitals  Blood pressure (!) 137/96, pulse (!) 105, temperature 97.8 F (36.6 C), temperature source Oral, resp. rate 18, height 5\' 7"  (1.702 m), weight 90.7 kg, last menstrual period 02/24/2024, SpO2 100%.   1. General Jaundiced female, anxious, has tremors  2. Normal affect and insight, Not Suicidal or Homicidal, Awake Alert, Oriented X 3.  3. No F.N deficits, ALL C.Nerves Intact, Strength 5/5 all 4 extremities, Sensation intact all 4 extremities, Plantars down going.  4. Ears and Eyes appear Normal,  Conjunctivae ictric, PERRLA. Moist Oral Mucosa.  5. Supple Neck, No JVD, No cervical lymphadenopathy appriciated, No Carotid Bruits.  6. Symmetrical Chest wall movement, Good air movement bilaterally, CTAB.  7. tachycardic , No Gallops, Rubs or Murmurs, No Parasternal Heave.  8. Positive Bowel Sounds, Abdomen Soft, No tenderness, No organomegaly appriciated,No rebound -guarding or rigidity.  9.  No Cyanosis, Normal Skin Turgor, No Skin Rash or Bruise.  10. Good muscle tone,  joints appear normal , no effusions, Normal ROM.    Data Review:    CBC Recent Labs  Lab 03/22/24 1338  WBC 3.3*  HGB 13.0  HCT 36.3  PLT 36*  MCV 93.1  MCH 33.3  MCHC 35.8  RDW 19.3*  LYMPHSABS 0.7  MONOABS 0.4  EOSABS 0.1  BASOSABS 0.0   ------------------------------------------------------------------------------------------------------------------  Chemistries  Recent Labs  Lab 03/22/24 1338  NA 131*  K 3.3*  CL 92*  CO2 23  GLUCOSE 219*  BUN 6  CREATININE 0.53  CALCIUM 8.8*  AST 390*  ALT 208*  ALKPHOS 236*  BILITOT 24.7*   ------------------------------------------------------------------------------------------------------------------ estimated creatinine clearance is 115.6 mL/min (by C-G formula based on SCr of 0.53 mg/dL). ------------------------------------------------------------------------------------------------------------------ No results for input(s): "TSH", "T4TOTAL", "T3FREE", "THYROIDAB" in the last 72 hours.  Invalid input(s): "FREET3"  Coagulation profile Recent Labs  Lab 03/22/24 1338  INR 1.2   ------------------------------------------------------------------------------------------------------------------- No results for input(s): "DDIMER" in the last 72 hours. -------------------------------------------------------------------------------------------------------------------  Cardiac Enzymes No results for input(s): "CKMB", "TROPONINI", "MYOGLOBIN"  in the last 168 hours.  Invalid input(s): "CK" ------------------------------------------------------------------------------------------------------------------ No results found for: "BNP"   ---------------------------------------------------------------------------------------------------------------  Urinalysis    Component Value Date/Time   COLORURINE AMBER (A) 03/22/2024 1321   APPEARANCEUR HAZY (A) 03/22/2024 1321   LABSPEC 1.023 03/22/2024 1321   PHURINE 6.0 03/22/2024 1321   GLUCOSEU 50 (A) 03/22/2024 1321   HGBUR NEGATIVE 03/22/2024 1321   BILIRUBINUR MODERATE (A) 03/22/2024 1321   KETONESUR 5 (A) 03/22/2024 1321   PROTEINUR 100 (A) 03/22/2024 1321   UROBILINOGEN 1.0 07/13/2014 2015   NITRITE NEGATIVE 03/22/2024 1321   LEUKOCYTESUR NEGATIVE 03/22/2024 1321    ----------------------------------------------------------------------------------------------------------------   Imaging Results:    US  Abdomen Limited RUQ (LIVER/GB) Result Date: 03/22/2024 CLINICAL DATA:  Right upper quadrant pain EXAM: ULTRASOUND ABDOMEN LIMITED  RIGHT UPPER QUADRANT COMPARISON:  Renal stone CT 04/03/2023 FINDINGS: Gallbladder: Set gallbladder with sludge. Few stones as well. No wall thickening or adjacent fluid. No reported sonographic Murphy's sign Common bile duct: Diameter: 6 mm Liver: Diffusely echogenic hepatic parenchyma consistent with fatty liver infiltration. With this level of echogenicity evaluation for underlying mass lesion is limited. Portal vein is patent on color Doppler imaging with normal direction of blood flow towards the liver. Other: None. IMPRESSION: Distended gallbladder with sludge and stones. No wall thickening or reported Murphy's sign. If there is further concern of acute cholecystitis a HIDA scan may be useful. The duct at 6 mm, upper limits of normal. This also could be evaluated further when clinically appropriate. Fatty liver infiltration. Known right renal collecting  system dilatation. Please correlate with prior CT Electronically Signed   By: Adrianna Horde M.D.   On: 03/22/2024 15:05   EKG:  Vent. rate 124 BPM PR interval 147 ms QRS duration 106 ms QT/QTcB 329/475 ms P-R-T axes 46 55 -11 Sinus tachycardia Borderline T abnormalities, diffuse leads Baseline wander in lead(s) I aVR V1 V3 V5 V6  Assessment & Plan:    Active Problems:   ADD (attention deficit disorder)   Alcohol use disorder   Cocaine abuse, episodic use (HCC)   Transaminitis   History of renal stone   Alcoholic hepatitis   Thrombocytopenia (HCC)  Addendum : - Notified by MRI team, they have attempted  to scan Ms Asaro and did not get any diagnostic images before she refused to continue the scan due to claustrophobia. she did however received her IVcontrast, so she now will have to wait 24 hours before her scan can be attempted again.   Transaminitis/alcoholic hepatitis/decompensated liver failure/hyperbilirubinemia - Patient presents with jaundice, thrombocytopenia, elevated LFTs, ultrasound significant for fatty liver, but no evidence of cirrhosis,. - Check hepatitis panel, and autoimmune work up. -MRCP per GI - In the gallbladder, with gallstones and sludge, but no gallbladder wall edema, no leukocytosis, tenderness to palpation, negative Murphy sign's, low clinical concern for cholecystitis. - Common bile duct within normal limit - Tylenol  level within normal limit. - Maddrey discriminant function is 28 mL, will hold on steroids and await further recommendations from GI  Alcohol abuse with alcohol withdrawal  Polysubstance abuse - Symptoms with alcohol withdrawals, last drink 2 days ago, tremors, tachypneic and tachycardic. - Upgrade her CIWA protocol to stepdown protocol - Continue with thiamine , folic acid  and multivitamin - Will add scheduled clonidine as well in the setting of hypertension and tachycardia and active withdrawals - Will add low-dose scheduled Librium . -  Reports she took Adderall that is why her amphetamine  was positive, she endorses snorting cocaine occasionally, she was counseled  Hypokalemia - Will replace, recheck in a.m., check magnesium  and phosphorus as well  Thrombocytopenia - Due to alcohol abuse, will keep on SCD for DVT prophylaxis  Metabolic acidosis - Anion gap of 16, this is most likely in the setting of patient ketosis from alcohol, continue with IV fluids  Hyponatremia - With IV fluids  Hyperglycemia - No history  diabetes mellitus, will check A1c  Obesity class I -Body mass index is 31.32 kg/m.   DVT Prophylaxis SCDs   AM Labs Ordered, also please review Full Orders  Family Communication: Admission, patients condition and plan of care including tests being ordered have been discussed with the patient who indicate understanding and agree with the plan and Code Status.  Code Status full code  Likely DC to  home  Consults called: Gastroenterology  Admission status: Inpatient  Time spent in minutes : 70 minutes   Seena Dadds M.D on 03/22/2024 at 4:59 PM   [facility] - Office  [phone]

## 2024-03-22 NOTE — ED Triage Notes (Signed)
 Pt noted jaundice 4 days ago.  Pt stopped drinking 2 days ago, withdrawal symptoms tremors and body aches. + nausea.  Noted urine is orange. Pt is daily ETOH use for 2 months, drinks off and on for years.

## 2024-03-22 NOTE — Progress Notes (Signed)
   03/22/24 2156  TOC Brief Assessment  Insurance and Status Reviewed  Patient has primary care physician Yes  Home environment has been reviewed From home  Prior level of function: Independent  Prior/Current Home Services No current home services  Social Drivers of Health Review SDOH reviewed interventions complete  Readmission risk has been reviewed Yes  Transition of care needs no transition of care needs at this time   Local addiction treatment center and AA schedule added to AVS.   Transition of Care Department (TOC) has reviewed patient and will continue to monitor.

## 2024-03-23 ENCOUNTER — Inpatient Hospital Stay (HOSPITAL_COMMUNITY)

## 2024-03-23 DIAGNOSIS — K7010 Alcoholic hepatitis without ascites: Secondary | ICD-10-CM | POA: Diagnosis not present

## 2024-03-23 DIAGNOSIS — R7401 Elevation of levels of liver transaminase levels: Secondary | ICD-10-CM | POA: Diagnosis not present

## 2024-03-23 DIAGNOSIS — F900 Attention-deficit hyperactivity disorder, predominantly inattentive type: Secondary | ICD-10-CM

## 2024-03-23 DIAGNOSIS — F1410 Cocaine abuse, uncomplicated: Secondary | ICD-10-CM | POA: Diagnosis not present

## 2024-03-23 LAB — HEPATIC FUNCTION PANEL
ALT: 199 U/L — ABNORMAL HIGH (ref 0–44)
ALT: 171 U/L — ABNORMAL HIGH (ref 0–44)
AST: 359 U/L — ABNORMAL HIGH (ref 15–41)
AST: 295 U/L — ABNORMAL HIGH (ref 15–41)
Albumin: 3.2 g/dL — ABNORMAL LOW (ref 3.5–5.0)
Albumin: 3 g/dL — ABNORMAL LOW (ref 3.5–5.0)
Alkaline Phosphatase: 204 U/L — ABNORMAL HIGH (ref 38–126)
Alkaline Phosphatase: 222 U/L — ABNORMAL HIGH (ref 38–126)
Bilirubin, Direct: <13.5 mg/dL — ABNORMAL HIGH (ref 0.0–0.2)
Bilirubin, Direct: 14.7 mg/dL — ABNORMAL HIGH (ref 0.0–0.2)
Indirect Bilirubin: 11.2 mg/dL (ref 0.3–0.9)
Indirect Bilirubin: 7.6 mg/dL — ABNORMAL HIGH (ref 0.3–0.9)
Total Bilirubin: 24.7 mg/dL (ref 0.0–1.2)
Total Bilirubin: 22.3 mg/dL (ref 0.0–1.2)
Total Protein: 6.8 g/dL (ref 6.5–8.1)
Total Protein: 6.1 g/dL — ABNORMAL LOW (ref 6.5–8.1)

## 2024-03-23 LAB — CBC
HCT: 31.3 % — ABNORMAL LOW (ref 36.0–46.0)
Hemoglobin: 11.1 g/dL — ABNORMAL LOW (ref 12.0–15.0)
MCH: 32.6 pg (ref 26.0–34.0)
MCHC: 35.5 g/dL (ref 30.0–36.0)
MCV: 92.1 fL (ref 80.0–100.0)
Platelets: 35 10*3/uL — ABNORMAL LOW (ref 150–400)
RBC: 3.4 MIL/uL — ABNORMAL LOW (ref 3.87–5.11)
RDW: 19.9 % — ABNORMAL HIGH (ref 11.5–15.5)
WBC: 3.8 10*3/uL — ABNORMAL LOW (ref 4.0–10.5)
nRBC: 0 % (ref 0.0–0.2)

## 2024-03-23 LAB — BASIC METABOLIC PANEL WITH GFR
Anion gap: 11 (ref 5–15)
BUN: 5 mg/dL — ABNORMAL LOW (ref 6–20)
CO2: 24 mmol/L (ref 22–32)
Calcium: 8.4 mg/dL — ABNORMAL LOW (ref 8.9–10.3)
Chloride: 100 mmol/L (ref 98–111)
Creatinine, Ser: 0.49 mg/dL (ref 0.44–1.00)
GFR, Estimated: >60 mL/min (ref >60)
Glucose, Bld: 129 mg/dL — ABNORMAL HIGH (ref 70–99)
Potassium: 3.5 mmol/L (ref 3.5–5.1)
Sodium: 135 mmol/L (ref 135–145)

## 2024-03-23 LAB — MAGNESIUM: Magnesium: 1.5 mg/dL — ABNORMAL LOW (ref 1.7–2.4)

## 2024-03-23 LAB — HEPATITIS PANEL, ACUTE
HCV Ab: NONREACTIVE
Hep A IgM: NONREACTIVE
Hep B C IgM: NONREACTIVE
Hepatitis B Surface Ag: NONREACTIVE

## 2024-03-23 LAB — MRSA NEXT GEN BY PCR, NASAL: MRSA by PCR Next Gen: NOT DETECTED

## 2024-03-23 MED ORDER — DIPHENHYDRAMINE HCL 25 MG PO CAPS
25.0000 mg | ORAL_CAPSULE | Freq: Every evening | ORAL | Status: DC | PRN
  Administered 2024-03-23 – 2024-03-26 (×2): 25 mg via ORAL
  Filled 2024-03-23 (×2): qty 1

## 2024-03-23 MED ORDER — DIPHENHYDRAMINE HCL 25 MG PO CAPS
25.0000 mg | ORAL_CAPSULE | Freq: Four times a day (QID) | ORAL | Status: DC | PRN
  Administered 2024-03-23 (×2): 25 mg via ORAL
  Filled 2024-03-23 (×2): qty 1

## 2024-03-23 MED ORDER — NOREPINEPHRINE 4 MG/250ML-% IV SOLN
INTRAVENOUS | Status: AC
  Filled 2024-03-23: qty 250

## 2024-03-23 MED ORDER — ADULT MULTIVITAMIN W/MINERALS CH
1.0000 | ORAL_TABLET | Freq: Every day | ORAL | Status: DC
  Administered 2024-03-24 – 2024-03-27 (×4): 1 via ORAL
  Filled 2024-03-23 (×4): qty 1

## 2024-03-23 MED ORDER — LACTATED RINGERS IV SOLN: INTRAVENOUS | Status: DC

## 2024-03-23 MED ORDER — MAGNESIUM SULFATE 4 GM/100ML IV SOLN
4.0000 g | Freq: Once | INTRAVENOUS | Status: AC
  Administered 2024-03-23: 4 g via INTRAVENOUS
  Filled 2024-03-23: qty 100

## 2024-03-23 MED ORDER — LORAZEPAM 2 MG/ML IJ SOLN
1.0000 mg | INTRAMUSCULAR | Status: DC | PRN
  Administered 2024-03-23 – 2024-03-26 (×4): 2 mg via INTRAVENOUS
  Administered 2024-03-26: 1 mg via INTRAVENOUS
  Administered 2024-03-26 – 2024-03-27 (×3): 2 mg via INTRAVENOUS
  Filled 2024-03-23 (×9): qty 1

## 2024-03-23 MED ORDER — FOLIC ACID 1 MG PO TABS
1.0000 mg | ORAL_TABLET | Freq: Every day | ORAL | Status: DC
  Administered 2024-03-24 – 2024-03-27 (×4): 1 mg via ORAL
  Filled 2024-03-23 (×4): qty 1

## 2024-03-23 MED ORDER — ARIPIPRAZOLE 5 MG PO TABS: 5.0000 mg | ORAL_TABLET | Freq: Every day | ORAL | Status: DC

## 2024-03-23 MED ORDER — GADOBUTROL 1 MMOL/ML IV SOLN
9.0000 mL | Freq: Once | INTRAVENOUS | Status: AC | PRN
Start: 2024-03-23 — End: 2024-03-23
  Administered 2024-03-23: 9 mL via INTRAVENOUS

## 2024-03-23 MED ORDER — THIAMINE HCL 100 MG/ML IJ SOLN
200.0000 mg | INTRAVENOUS | Status: DC
  Filled 2024-03-23 (×7): qty 2

## 2024-03-23 MED ORDER — DEXMEDETOMIDINE HCL IN NACL 400 MCG/100ML IV SOLN
0.0000 ug/kg/h | INTRAVENOUS | Status: AC
Start: 2024-03-23 — End: 2024-03-23

## 2024-03-23 MED ORDER — ONDANSETRON HCL 4 MG/2ML IJ SOLN
4.0000 mg | Freq: Four times a day (QID) | INTRAMUSCULAR | Status: DC | PRN
  Administered 2024-03-23: 4 mg via INTRAVENOUS
  Filled 2024-03-23: qty 2

## 2024-03-23 MED ORDER — PHENOBARBITAL SODIUM 65 MG/ML IJ SOLN: 32.5000 mg | Freq: Three times a day (TID) | INTRAMUSCULAR | Status: DC

## 2024-03-23 MED ORDER — THIAMINE MONONITRATE 100 MG PO TABS
100.0000 mg | ORAL_TABLET | ORAL | Status: DC
  Administered 2024-03-24 – 2024-03-27 (×4): 100 mg via ORAL
  Filled 2024-03-23 (×4): qty 1

## 2024-03-23 MED ORDER — HALOPERIDOL LACTATE 5 MG/ML IJ SOLN
5.0000 mg | Freq: Once | INTRAMUSCULAR | Status: AC
  Administered 2024-03-23: 5 mg via INTRAVENOUS
  Filled 2024-03-23: qty 1

## 2024-03-23 MED ORDER — HALOPERIDOL LACTATE 5 MG/ML IJ SOLN
5.0000 mg | Freq: Four times a day (QID) | INTRAMUSCULAR | Status: DC | PRN
  Administered 2024-03-24 – 2024-03-27 (×5): 5 mg via INTRAVENOUS
  Filled 2024-03-23 (×5): qty 1

## 2024-03-23 MED ORDER — PHENOBARBITAL SODIUM 130 MG/ML IJ SOLN
65.0000 mg | Freq: Three times a day (TID) | INTRAMUSCULAR | Status: AC
Start: 2024-03-25 — End: 2024-03-27
  Administered 2024-03-25 – 2024-03-27 (×6): 65 mg via INTRAVENOUS
  Filled 2024-03-23 (×6): qty 1

## 2024-03-23 MED ORDER — CHLORDIAZEPOXIDE HCL 25 MG PO CAPS: 25.0000 mg | ORAL_CAPSULE | Freq: Four times a day (QID) | ORAL | Status: DC

## 2024-03-23 MED ORDER — DEXMEDETOMIDINE HCL IN NACL 400 MCG/100ML IV SOLN
INTRAVENOUS | Status: AC
  Administered 2024-03-23: 1.2 ug/kg/h via INTRAVENOUS
  Filled 2024-03-23: qty 100

## 2024-03-23 MED ORDER — PHENOBARBITAL SODIUM 130 MG/ML IJ SOLN
97.5000 mg | Freq: Three times a day (TID) | INTRAMUSCULAR | Status: AC
  Administered 2024-03-23 – 2024-03-25 (×6): 97.5 mg via INTRAVENOUS
  Filled 2024-03-23 (×6): qty 1

## 2024-03-23 MED ORDER — MIDAZOLAM HCL 2 MG/2ML IJ SOLN
1.0000 mg | INTRAMUSCULAR | Status: DC | PRN
  Administered 2024-03-23: 2 mg via INTRAVENOUS
  Filled 2024-03-23: qty 2

## 2024-03-23 NOTE — Progress Notes (Signed)
 Arrived to unit, patient crawling out of bed, hyper verbal, and kicking at staff. Dr. Lincoln Renshaw at bedside & consulted critical care. Per Dr. Lincoln Renshaw wean off precedx in the next hour after giving Pheno. Continue to score with CIWA.

## 2024-03-23 NOTE — Progress Notes (Signed)
 PROGRESS NOTE   Stephanie Mercado  UEA:[id number] DOB: October 19, 1991 DOA: 03/22/2024 PCP: [name], FNP   Chief Complaint  Patient presents with   Jaundice   Level of care: Stepdown  Brief Admission History:  33 year old female alcoholic last drink about 3 days ago, polysubstance abuse, benzodiazepine abuse and addiction, ADHD on Adderall, history of opioid overdose, alcoholic hepatitis, hyperbilirubinemia with elevated LFTs, history of cocaine abuse, has had increasing confusion at home over the past few hours prior to arrival also notably having more jaundiced in the eyes and skin.  She had admitted to using cocaine Suboxone and Adderall recently.  Pt reportedly drinks Four Loko daily.  It is a flavored malt beverage known for high alcohol content, ranging from 8% to 14% alcohol by volume.  Her workup in the ED was positive for acute hepatic dysfunction with elevated LFTs and hyperbilirubinemia and severe thrombocytopenia with a platelet count of 36, bilirubin was 25.  Her hemoglobin was 13.  WBC 3.3.  Her acute hepatitis panel testing was negative.  INR was reassuring at 1.2.  Pt was admitted to ICU for acute alcohol withdrawal and subsequently developed delirium tremens.     Assessment and Plan:  Acute Alcohol Withdrawal  Delirium Tremens  - Patient currently having very severe alcohol withdrawal symptoms severely agitated and after I started a Precedex  infusion to max dose she had no response to this in addition to doses of IV midazolam  and IV haloperidol .  I reached out to critical care team at [facility] and spoke with Dr. Katrine Parody who recommended phenobarbital protocol.  I called the Pharm.D. covering our ICU and requested to initiate the phenobarbital alcohol withdrawal protocol.  Continue CIWA.  Continue Haldol  as needed.  Continue neurochecks.  Continue IV fluid hydration and other supportive measures.  If patient is subsequently intubated Dr. Katrine Parody says patient would need to  transfer to [facility].  Foley placed due to critical illness.   Hypotension - Patient had a brief episode of hypotension likely result of the high doses of Precedex  which has subsequently been discontinued. - she is being hydrated with IV fluids  Alcoholic Hepatitis Hyperbilirubinemia Transaminitis - appreciate GI consultation, did not feel steroids needed at this time - further recommendations to follow - continue supportive measures   Severe thrombocytopenia  - platelets down to 35  - secondary to liver disease  - avoiding heparins  DVT prophylaxis: SCDs Code Status: Full  Family Communication: bedsidefianceparents Disposition: remain in ICU    Consultants:  GI PCCM (telephone)  Procedures:   Antimicrobials:    Subjective: Pt is severely agitated, confused,  hallucinating, delirious  Objective: Vitals:   03/23/24 1200 03/23/24 1245 03/23/24 1246 03/23/24 1255  BP: (!) 89/58   101/65  Pulse: 72   73  Resp: 19   18  Temp: (!) 97 F (36.1 C) (!) 96.3 F (35.7 C) (!) 96.8 F (36 C) (!) 96.1 F (35.6 C)  TempSrc: Bladder Bladder Axillary Bladder  SpO2: 98%   100%  Weight:      Height:        Intake/Output Summary (Last 24 hours) at 03/23/2024 1258 Last data filed at 03/23/2024 1200 Gross per 24 hour  Intake 2048.94 ml  Output 550 ml  Net 1498.94 ml   Filed Weights   03/22/24 1317 03/22/24 2008  Weight: 90.7 kg 92.3 kg   Examination:  General exam: severe agitation, delirium, confusion, combativeness;  Respiratory system: Clear to auscultation. Respiratory effort normal. Cardiovascular  system: normal S1 & S2 heard. No JVD, murmurs, rubs, gallops or clicks. No pedal edema. Gastrointestinal system: Abdomen is nondistended, soft and nontender. No organomegaly or masses felt. Normal bowel sounds heard. Central nervous system: Alert and oriented. No focal neurological deficits. Extremities: Symmetric 5 x 5 power. Skin: No rashes, lesions or  ulcers. Psychiatry: Judgement and insight appear normal. Mood & affect appropriate.   Data Reviewed: I have personally reviewed following labs and imaging studies  CBC: Recent Labs  Lab 03/22/24 1338 03/23/24 0512  WBC 3.3* 3.8*  NEUTROABS 2.2  --   HGB 13.0 11.1*  HCT 36.3 31.3*  MCV 93.1 92.1  PLT 36* 35*    Basic Metabolic Panel: Recent Labs  Lab 03/22/24 1338 03/23/24 0512 03/23/24 0853  NA 131* 135  --   K 3.3* 3.5  --   CL 92* 100  --   CO2 23 24  --   GLUCOSE 219* 129*  --   BUN 6 5*  --   CREATININE 0.53 0.49  --   CALCIUM 8.8* 8.4*  --   MG 1.4*  --  1.5*  PHOS 2.4*  --   --     CBG: No results for input(s): "GLUCAP" in the last 168 hours.  Recent Results (from the past 240 hours)  MRSA Next Gen by PCR, Nasal     Status: None   Collection Time: 03/22/24  8:01 PM   Specimen: Nasal Mucosa; Nasal Swab  Result Value Ref Range Status   MRSA by PCR Next Gen NOT DETECTED NOT DETECTED Final    Comment: (NOTE) The GeneXpert MRSA Assay (FDA approved for NASAL specimens only), is one component of a comprehensive MRSA colonization surveillance program. It is not intended to diagnose MRSA infection nor to guide or monitor treatment for MRSA infections. Test performance is not FDA approved in patients less than 15 years old. Performed at [facility], [address] [place], [place] [address]      Radiology Studies: US  Abdomen Limited RUQ (LIVER/GB) Result Date: 03/22/2024 CLINICAL DATA:  Right upper quadrant pain EXAM: ULTRASOUND ABDOMEN LIMITED RIGHT UPPER QUADRANT COMPARISON:  Renal stone CT 04/03/2023 FINDINGS: Gallbladder: Set gallbladder with sludge. Few stones as well. No wall thickening or adjacent fluid. No reported sonographic Murphy's sign Common bile duct: Diameter: 6 mm Liver: Diffusely echogenic hepatic parenchyma consistent with fatty liver infiltration. With this level of echogenicity evaluation for underlying mass lesion is limited. Portal vein is  patent on color Doppler imaging with normal direction of blood flow towards the liver. Other: None. IMPRESSION: Distended gallbladder with sludge and stones. No wall thickening or reported Murphy's sign. If there is further concern of acute cholecystitis a HIDA scan may be useful. The duct at 6 mm, upper limits of normal. This also could be evaluated further when clinically appropriate. Fatty liver infiltration. Known right renal collecting system dilatation. Please correlate with prior CT Electronically Signed   By: Adrianna Horde M.D.   On: 03/22/2024 15:05    Scheduled Meds:  Chlorhexidine Gluconate Cloth  6 each Topical Daily   folic acid   1 mg Oral Daily   multivitamin with minerals  1 tablet Oral Daily   PHENObarbital  97.5 mg Intravenous Q8H   Followed by   [START ON 03/25/2024] PHENObarbital  65 mg Intravenous Q8H   Followed by   [START ON 03/27/2024] PHENObarbital  32.5 mg Intravenous Q8H   thiamine   100 mg Oral Q24H   Continuous Infusions:  lactated  ringers  200 mL/hr at 03/23/24 1200   norepinephrine     thiamine  (VITAMIN B1) injection       LOS: 1 day   Critical Care Procedure Note Authorized and Performed by: Olga Berthold MD  Total Critical Care time:  75 mins Due to a high probability of clinically significant, life threatening deterioration, the patient required my highest level of preparedness to intervene emergently and I personally spent this critical care time directly and personally managing the patient.  This critical care time included obtaining a history; examining the patient, pulse oximetry; ordering and review of studies; arranging urgent treatment with development of a management plan; evaluation of patient's response of treatment; frequent reassessment; and discussions with other providers.  This critical care time was performed to assess and manage the high probability of imminent and life threatening deterioration that could result in multi-organ failure.  It was  exclusive of separately billable procedures and treating other patients and teaching time.    Faustino Hook, MD How to contact the [facility] Attending or Consulting provider 7A - 7P or covering provider during after hours 7P -7A, for this patient?  Check the care team in [facility] and look for a) attending/consulting TR[facility] provider listed and b) the TR[facility] team listed Log into [email or web address] to find provider on call.  Locate the [facility] provider you are looking for under [facility] and page to a number that you can be directly reached. If you still have difficulty reaching the provider, please page the [facility] (Director on Call) for the Hospitalists listed on amion for assistance.  03/23/2024, 12:58 PM

## 2024-03-23 NOTE — Plan of Care (Signed)
  Problem: Clinical Measurements: Goal: Ability to maintain clinical measurements within normal limits will improve Outcome: Progressing Goal: Will remain free from infection Outcome: Progressing Goal: Diagnostic test results will improve Outcome: Progressing Goal: Respiratory complications will improve Outcome: Progressing Goal: Cardiovascular complication will be avoided Outcome: Progressing   Problem: Activity: Goal: Risk for activity intolerance will decrease Outcome: Progressing   Problem: Nutrition: Goal: Adequate nutrition will be maintained Outcome: Progressing   Problem: Coping: Goal: Level of anxiety will decrease Outcome: Progressing   Problem: Pain Managment: Goal: General experience of comfort will improve and/or be controlled Outcome: Progressing   Problem: Safety: Goal: Ability to remain free from injury will improve Outcome: Progressing   Problem: Skin Integrity: Goal: Risk for impaired skin integrity will decrease Outcome: Progressing   Problem: Safety: Goal: Non-violent Restraint(s) Outcome: Progressing

## 2024-03-23 NOTE — Plan of Care (Signed)
  Problem: Clinical Measurements: Goal: Diagnostic test results will improve Outcome: Not Progressing   Problem: Coping: Goal: Level of anxiety will decrease Outcome: Not Progressing   

## 2024-03-23 NOTE — Progress Notes (Signed)
 Patient hypotensive when sleeping & calm. Precedex  stopped. MD notified. Dr. Lincoln Renshaw at bedside okay for MAP goal of 60 when calm & sleeping.

## 2024-03-23 NOTE — Progress Notes (Addendum)
 Mark at bedside (pt's fiance). Says he and patient were texting this AM around 0100 and she wasn't making sense. Stephanie Mercado says he will come by later and that her parents may be visiting around 1500 as well.  Mark's #: [phone]  Stephanie Mercado endorses her last drink was 3 days ago & that she drinks Four Lokos.

## 2024-03-23 NOTE — Progress Notes (Incomplete)
 Pt initally came in a/o x 4, giving Ativan  as per CIWA.

## 2024-03-23 NOTE — Progress Notes (Incomplete)
 Subjective: ***  Objective: Vital signs in last 24 hours: Temp:  [97.8 F (36.6 C)-98.6 F (37 C)] 98.1 F (36.7 C) (05/10 0735) Pulse Rate:  [103-151] 151 (05/10 0800) Resp:  [16-29] 27 (05/10 0800) BP: (114-168)/(73-108) 167/89 (05/10 0800) SpO2:  [97 %-100 %] 100 % (05/10 0800) Weight:  [90.7 kg-92.3 kg] 92.3 kg (05/09 2008)   General:   Alert,  Well-developed, well-nourished, pleasant and cooperative in NAD Abdomen:  Soft, nontender and nondistended.  Normal bowel sounds, without guarding, and without rebound.  No mass or organomegaly. Extremities:  Without clubbing or edema.    Intake/Output from previous day: 05/09 0701 - 05/10 0700 In: 1227.4 [P.O.:240; I.V.:987.4] Out: -  Intake/Output this shift: No intake/output data recorded.  Lab Results: Recent Labs    03/22/24 1338 03/23/24 0512  WBC 3.3* 3.8*  HGB 13.0 11.1*  HCT 36.3 31.3*  PLT 36* 35*   BMET Recent Labs    03/22/24 1338 03/23/24 0512  NA 131* 135  K 3.3* 3.5  CL 92* 100  CO2 23 24  GLUCOSE 219* 129*  BUN 6 5*  CREATININE 0.53 0.49  CALCIUM 8.8* 8.4*   LFT Recent Labs    03/23/24 0512  PROT 6.1*  ALBUMIN 3.0*  AST 295*  ALT 171*  ALKPHOS 222*  BILITOT 22.3*  BILIDIR 14.7*  IBILI 7.6*   PT/INR Recent Labs    03/22/24 1338  LABPROT 15.8*  INR 1.2   Hepatitis Panel No results for input(s): "HEPBSAG", "HCVAB", "HEPAIGM", "HEPBIGM" in the last 72 hours. C-Diff No results for input(s): "CDIFFTOX" in the last 72 hours.  Studies/Results: US  Abdomen Limited RUQ (LIVER/GB) Result Date: 03/22/2024 CLINICAL DATA:  Right upper quadrant pain EXAM: ULTRASOUND ABDOMEN LIMITED RIGHT UPPER QUADRANT COMPARISON:  Renal stone CT 04/03/2023 FINDINGS: Gallbladder: Set gallbladder with sludge. Few stones as well. No wall thickening or adjacent fluid. No reported sonographic Murphy's sign Common bile duct: Diameter: 6 mm Liver: Diffusely echogenic hepatic parenchyma consistent with fatty liver  infiltration. With this level of echogenicity evaluation for underlying mass lesion is limited. Portal vein is patent on color Doppler imaging with normal direction of blood flow towards the liver. Other: None. IMPRESSION: Distended gallbladder with sludge and stones. No wall thickening or reported Murphy's sign. If there is further concern of acute cholecystitis a HIDA scan may be useful. The duct at 6 mm, upper limits of normal. This also could be evaluated further when clinically appropriate. Fatty liver infiltration. Known right renal collecting system dilatation. Please correlate with prior CT Electronically Signed   By: Adrianna Horde M.D.   On: 03/22/2024 15:05    Assessment: Principal Problem:   Transaminitis Active Problems:   ADD (attention deficit disorder)   Alcohol use disorder   Cocaine abuse, episodic use (HCC)   History of renal stone   Alcoholic hepatitis   Thrombocytopenia (HCC)    Plan:   LOS: 1 day   Garnette Ka  03/23/2024, 8:11 AM

## 2024-03-23 NOTE — Plan of Care (Signed)
 Patient explained during shift report she only wants Stephanie Mercado to get her information.  At this time she wants no labs or information to be given to her parents or other relatives to respect her privacy.  She is still a little confused and severely anxious.  Patient had plan of care for this evening, pain management and medications explained with some teach back.  Patient asked and MD did order some sleep medication for her.  Hopefully by morning we can discontinue the safety sitter as the restraints recently have been.   Problem: Education: Goal: Knowledge of General Education information will improve Description: Including pain rating scale, medication(s)/side effects and non-pharmacologic comfort measures Outcome: Not Progressing   Problem: Health Behavior/Discharge Planning: Goal: Ability to manage health-related needs will improve Outcome: Not Progressing   Problem: Coping: Goal: Level of anxiety will decrease Outcome: Not Progressing   Problem: Safety: Goal: Ability to remain free from injury will improve Outcome: Not Progressing

## 2024-03-23 NOTE — Progress Notes (Signed)
 This Investment banker, operational is now 1:1 with patient for safety.
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
## 2024-03-23 NOTE — TOC CM/SW Note (Signed)
 TOC recognize consult for Substance Abuse Couseling/Education. CM at bedside patient with alcohol  withdrawal symptoms. Resting at the time CM at bedside.

## 2024-03-23 NOTE — Hospital Course (Addendum)
 33 year old female alcoholic last drink about 3 days ago, polysubstance abuse, benzodiazepine abuse and addiction, ADHD on Adderall, history of opioid overdose, alcoholic hepatitis, hyperbilirubinemia with elevated LFTs, history of cocaine abuse, has had increasing confusion at home over the past few hours prior to arrival also notably having more jaundiced in the eyes and skin.  She had admitted to using cocaine Suboxone and Adderall recently.  Pt reportedly drinks Four Loko daily.  It is a flavored malt beverage known for high alcohol content, ranging from 8% to 14% alcohol by volume.  Her workup in the ED was positive for acute hepatic dysfunction with elevated LFTs and hyperbilirubinemia and severe thrombocytopenia with a platelet count of 36, bilirubin was 25.  Her hemoglobin was 13.  WBC 3.3.  Her acute hepatitis panel testing was negative.  INR was reassuring at 1.2.  Pt was admitted to ICU for acute alcohol withdrawal and subsequently developed delirium tremens.  She continued to gave agitation on Precedex  and required phenobarbital.  Over the next 2-3 days, her mental returned to baseline and her phenobarb was weaned. Her diet was advanced which she tolerated.  She had no abd pain, n/v and no signs of encephalopathy.  GI followed patient and will arrange for outpt follow up.

## 2024-03-24 DIAGNOSIS — Z87442 Personal history of urinary calculi: Secondary | ICD-10-CM

## 2024-03-24 DIAGNOSIS — F10139 Alcohol abuse with withdrawal, unspecified: Secondary | ICD-10-CM

## 2024-03-24 DIAGNOSIS — D696 Thrombocytopenia, unspecified: Secondary | ICD-10-CM | POA: Diagnosis not present

## 2024-03-24 DIAGNOSIS — F1410 Cocaine abuse, uncomplicated: Secondary | ICD-10-CM | POA: Diagnosis not present

## 2024-03-24 DIAGNOSIS — N135 Crossing vessel and stricture of ureter without hydronephrosis: Secondary | ICD-10-CM

## 2024-03-24 DIAGNOSIS — K7010 Alcoholic hepatitis without ascites: Secondary | ICD-10-CM | POA: Diagnosis not present

## 2024-03-24 DIAGNOSIS — R7401 Elevation of levels of liver transaminase levels: Secondary | ICD-10-CM | POA: Diagnosis not present

## 2024-03-24 LAB — CBC
HCT: 29.5 % — ABNORMAL LOW (ref 36.0–46.0)
Hemoglobin: 10.2 g/dL — ABNORMAL LOW (ref 12.0–15.0)
MCH: 33.2 pg (ref 26.0–34.0)
MCHC: 34.6 g/dL (ref 30.0–36.0)
MCV: 96.1 fL (ref 80.0–100.0)
Platelets: 45 10*3/uL — ABNORMAL LOW (ref 150–400)
RBC: 3.07 MIL/uL — ABNORMAL LOW (ref 3.87–5.11)
RDW: 21.4 % — ABNORMAL HIGH (ref 11.5–15.5)
WBC: 4.1 10*3/uL (ref 4.0–10.5)
nRBC: 0 % (ref 0.0–0.2)

## 2024-03-24 LAB — COMPREHENSIVE METABOLIC PANEL WITH GFR
ALT: 136 U/L — ABNORMAL HIGH (ref 0–44)
AST: 207 U/L — ABNORMAL HIGH (ref 15–41)
Albumin: 2.5 g/dL — ABNORMAL LOW (ref 3.5–5.0)
Alkaline Phosphatase: 175 U/L — ABNORMAL HIGH (ref 38–126)
Anion gap: 9 (ref 5–15)
BUN: 5 mg/dL — ABNORMAL LOW (ref 6–20)
CO2: 21 mmol/L — ABNORMAL LOW (ref 22–32)
Calcium: 8 mg/dL — ABNORMAL LOW (ref 8.9–10.3)
Chloride: 107 mmol/L (ref 98–111)
Creatinine, Ser: 0.63 mg/dL (ref 0.44–1.00)
GFR, Estimated: >60 mL/min (ref >60)
Glucose, Bld: 135 mg/dL — ABNORMAL HIGH (ref 70–99)
Potassium: 3.6 mmol/L (ref 3.5–5.1)
Sodium: 137 mmol/L (ref 135–145)
Total Bilirubin: 19.2 mg/dL (ref 0.0–1.2)
Total Protein: 5.1 g/dL — ABNORMAL LOW (ref 6.5–8.1)

## 2024-03-24 LAB — AMMONIA: Ammonia: 77 umol/L — ABNORMAL HIGH (ref 9–35)

## 2024-03-24 LAB — MAGNESIUM: Magnesium: 1.9 mg/dL (ref 1.7–2.4)

## 2024-03-24 LAB — PROTIME-INR
INR: 1.3 — ABNORMAL HIGH (ref 0.8–1.2)
Prothrombin Time: 16.3 s — ABNORMAL HIGH (ref 11.4–15.2)

## 2024-03-24 LAB — ANTI-SMOOTH MUSCLE ANTIBODY, IGG: F-Actin IgG: 9 U (ref 0–19)

## 2024-03-24 LAB — MITOCHONDRIAL ANTIBODIES: Mitochondrial M2 Ab, IgG: <20 U (ref 0.0–20.0)

## 2024-03-24 MED ORDER — VITAMIN K1 10 MG/ML IJ SOLN
10.0000 mg | Freq: Once | INTRAVENOUS | Status: AC
  Administered 2024-03-24: 10 mg via INTRAVENOUS
  Filled 2024-03-24: qty 1

## 2024-03-24 NOTE — Progress Notes (Signed)
 PROGRESS NOTE   Stephanie Mercado  [name]:[id number] DOB: 29-Jun-1991 DOA: 03/22/2024 PCP: [name], FNP   Chief Complaint  Patient presents with   Jaundice   Level of care: Stepdown  Brief Admission History:  33 year old female alcoholic last drink about 3 days ago, polysubstance abuse, benzodiazepine abuse and addiction, ADHD on Adderall, history of opioid overdose, alcoholic hepatitis, hyperbilirubinemia with elevated LFTs, history of cocaine abuse, has had increasing confusion at home over the past few hours prior to arrival also notably having more jaundiced in the eyes and skin.  She had admitted to using cocaine Suboxone and Adderall recently.  Pt reportedly drinks Four Loko daily.  It is a flavored malt beverage known for high alcohol content, ranging from 8% to 14% alcohol by volume.  Her workup in the ED was positive for acute hepatic dysfunction with elevated LFTs and hyperbilirubinemia and severe thrombocytopenia with a platelet count of 36, bilirubin was 25.  Her hemoglobin was 13.  WBC 3.3.  Her acute hepatitis panel testing was negative.  INR was reassuring at 1.2.  Pt was admitted to ICU for acute alcohol withdrawal and subsequently developed delirium tremens.     Assessment and Plan:  Acute Alcohol Withdrawal  Delirium Tremens  - Patient currently having very severe alcohol withdrawal symptoms severely agitated and after I started a Precedex  infusion to max dose she had no response to this in addition to doses of IV midazolam  and IV haloperidol .  I reached out to critical care team at [facility] and spoke with Dr. Katrine Parody who recommended phenobarbital protocol.  I called the Pharm.D. covering our ICU and requested to initiate the phenobarbital alcohol withdrawal protocol.  Continue CIWA.  Continue Haldol  as needed.  Continue neurochecks.  Continue IV fluid hydration and other supportive measures.  If patient is subsequently intubated Dr. Katrine Parody says patient would need to  transfer to [facility].  Foley placed due to critical illness.  - Pt seems to be responding favorably to the phenobarbital alcohol withdrawal protocol, pt has been less severely agitated, less combative and has been able to eat and drink and take some oral medications;   Hypotension - Patient had a brief episode of hypotension likely result of the high doses of Precedex  which has subsequently been discontinued. - she is being hydrated with IV fluids which we reduced from 200 to 125 cc/hour  Alcoholic Hepatitis Hyperbilirubinemia Transaminitis Hyperammonemia - appreciate GI consultation, did not feel steroids needed at this time - further recommendations to follow - continue supportive measures  - ammonia up today at 77, will defer need for lactulose to GI team  Severe thrombocytopenia  - platelets down to 45  - secondary to liver disease  - avoiding heparins - no active bleeding found  DVT prophylaxis: SCDs Code Status: Full  Family Communication: bedsidefianceparents Disposition: remain in ICU    Consultants:  GI PCCM (telephone)  Procedures:   Antimicrobials:    Subjective: Pt is somnolent but arousable, with intermittent bouts of confusion  Objective: Vitals:   03/24/24 0557 03/24/24 0607 03/24/24 0735 03/24/24 0800  BP:   (!) 132/93 (!) 117/90  Pulse:  96 96 (!) 103  Resp: 17  (!) 35 (!) 27  Temp:    97.6 F (36.4 C)  TempSrc:    Oral  SpO2: 97%  99% (!) 89%  Weight:      Height:        Intake/Output Summary (Last 24 hours) at 03/24/2024 8119 Last data  filed at 03/24/2024 0800 Gross per 24 hour  Intake 4753.29 ml  Output 875 ml  Net 3878.29 ml   Filed Weights   03/22/24 1317 03/22/24 2008  Weight: 90.7 kg 92.3 kg   Examination:  General exam: much less agitation, delirium, confusion, combativeness than yesterday;  Respiratory system: Clear to auscultation. Respiratory effort normal. Cardiovascular system: normal S1 & S2 heard. No JVD, murmurs,  rubs, gallops or clicks. No pedal edema. Gastrointestinal system: Abdomen is nondistended, soft and nontender. No organomegaly or masses felt. Normal bowel sounds heard. Central nervous system: Alert and oriented. No focal neurological deficits. Extremities: Symmetric 5 x 5 power. Skin: No rashes, lesions or ulcers. Psychiatry: Judgement and insight appear poor. Mood & affect severely anxious, delirious.   Data Reviewed: I have personally reviewed following labs and imaging studies  CBC: Recent Labs  Lab 03/22/24 1338 03/23/24 0512 03/24/24 0556  WBC 3.3* 3.8* 4.1  NEUTROABS 2.2  --   --   HGB 13.0 11.1* 10.2*  HCT 36.3 31.3* 29.5*  MCV 93.1 92.1 96.1  PLT 36* 35* 45*    Basic Metabolic Panel: Recent Labs  Lab 03/22/24 1338 03/23/24 0512 03/23/24 0853 03/24/24 0556  NA 131* 135  --  137  K 3.3* 3.5  --  3.6  CL 92* 100  --  107  CO2 23 24  --  21*  GLUCOSE 219* 129*  --  135*  BUN 6 5*  --  5*  CREATININE 0.53 0.49  --  0.63  CALCIUM 8.8* 8.4*  --  8.0*  MG 1.4*  --  1.5* 1.9  PHOS 2.4*  --   --   --     CBG: No results for input(s): "GLUCAP" in the last 168 hours.  Recent Results (from the past 240 hours)  MRSA Next Gen by PCR, Nasal     Status: None   Collection Time: 03/22/24  8:01 PM   Specimen: Nasal Mucosa; Nasal Swab  Result Value Ref Range Status   MRSA by PCR Next Gen NOT DETECTED NOT DETECTED Final    Comment: (NOTE) The GeneXpert MRSA Assay (FDA approved for NASAL specimens only), is one component of a comprehensive MRSA colonization surveillance program. It is not intended to diagnose MRSA infection nor to guide or monitor treatment for MRSA infections. Test performance is not FDA approved in patients less than 5 years old. Performed at [facility], [address] [place], [place] [address]      Radiology Studies: MR ABDOMEN MRCP W WO CONTAST Result Date: 03/23/2024 CLINICAL DATA:  Jaundiced. EXAM: MRI ABDOMEN WITHOUT AND WITH CONTRAST  (INCLUDING MRCP) TECHNIQUE: Multiplanar multisequence MR imaging of the abdomen was performed both before and after the administration of intravenous contrast. Heavily T2-weighted images of the biliary and pancreatic ducts were obtained, and three-dimensional MRCP images were rendered by post processing. CONTRAST:  9mL GADAVIST GADOBUTROL 1 MMOL/ML IV SOLN, 9mL GADAVIST GADOBUTROL 1 MMOL/ML IV SOLN COMPARISON:  Prior CT scan from 04/11/2023 and right upper quadrant ultrasound examination 03/22/2024 FINDINGS: Lower chest: The lung bases are grossly clear. No pulmonary lesions or pleural effusions. No pericardial effusion. Hepatobiliary: Moderate hepatomegaly. There is severe diffuse fatty infiltration of the liver but no worrisome hepatic lesions. No intrahepatic biliary dilatation. Normal caliber and course of the common bile duct. No common bile duct stones are identified. The gallbladder is abnormal. It demonstrates mild distension and there are numerous small dependently layering gallstones along with gallbladder sludge. There is also  thickening of the gallbladder wall, pericholecystic fluid and contrast enhancement of the gallbladder wall. Findings suggest acute cholecystitis. Pancreas: No mass, inflammatory changes, or other parenchymal abnormality identified. Spleen:  Borderline splenic enlargement.  No splenic lesions. Adrenals/Urinary Tract:  The adrenal glands are normal. 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 The left kidney is unremarkable. Stomach/Bowel: Visualized portions within the abdomen are unremarkable. Vascular/Lymphatic: The aorta and branch vessels are normal. The major venous structures are patent. No mesenteric or retroperitoneal lymphadenopathy. Other: No ascites. There is a small amount of pericholecystic fluid. No subcutaneous lesions. Musculoskeletal: Thoracolumbar scoliosis.  No bone lesions. IMPRESSION: 1. MR findings  consistent with acute cholecystitis. 2. Normal caliber and course of the common bile duct. No common bile duct stones are identified. 3. Moderate hepatomegaly and severe diffuse fatty infiltration of the liver. 4. 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 Electronically Signed   By: Marrian Siva M.D.   On: 03/23/2024 21:38   MR 3D Recon At Scanner Result Date: 03/23/2024 CLINICAL DATA:  Jaundiced. EXAM: MRI ABDOMEN WITHOUT AND WITH CONTRAST (INCLUDING MRCP) TECHNIQUE: Multiplanar multisequence MR imaging of the abdomen was performed both before and after the administration of intravenous contrast. Heavily T2-weighted images of the biliary and pancreatic ducts were obtained, and three-dimensional MRCP images were rendered by post processing. CONTRAST:  9mL GADAVIST GADOBUTROL 1 MMOL/ML IV SOLN, 9mL GADAVIST GADOBUTROL 1 MMOL/ML IV SOLN COMPARISON:  Prior CT scan from 04/11/2023 and right upper quadrant ultrasound examination 03/22/2024 FINDINGS: Lower chest: The lung bases are grossly clear. No pulmonary lesions or pleural effusions. No pericardial effusion. Hepatobiliary: Moderate hepatomegaly. There is severe diffuse fatty infiltration of the liver but no worrisome hepatic lesions. No intrahepatic biliary dilatation. Normal caliber and course of the common bile duct. No common bile duct stones are identified. The gallbladder is abnormal. It demonstrates mild distension and there are numerous small dependently layering gallstones along with gallbladder sludge. There is also thickening of the gallbladder wall, pericholecystic fluid and contrast enhancement of the gallbladder wall. Findings suggest acute cholecystitis. Pancreas: No mass, inflammatory changes, or other parenchymal abnormality identified. Spleen:  Borderline splenic enlargement.  No splenic lesions. Adrenals/Urinary Tract:  The adrenal glands are normal. Chronic significant  right-sided UPJ obstruction with hydronephrosis and associated renal cortical thinning. The left kidney is unremarkable. Stomach/Bowel: Visualized portions within the abdomen are unremarkable. Vascular/Lymphatic: The aorta and branch vessels are normal. The major venous structures are patent. No mesenteric or retroperitoneal lymphadenopathy. Other: No ascites. There is a small amount of pericholecystic fluid. No subcutaneous lesions. Musculoskeletal: Thoracolumbar scoliosis.  No bone lesions. IMPRESSION: 1. MR findings consistent with acute cholecystitis. 2. Normal caliber and course of the common bile duct. No common bile duct stones are identified. 3. Moderate hepatomegaly and severe diffuse fatty infiltration of the liver. 4. 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 Electronically Signed   By: Marrian Siva M.D.   On: 03/23/2024 21:38   US  Abdomen Limited RUQ (LIVER/GB) Result Date: 03/22/2024 CLINICAL DATA:  Right upper quadrant pain EXAM: ULTRASOUND ABDOMEN LIMITED RIGHT UPPER QUADRANT COMPARISON:  Renal stone CT 04/03/2023 FINDINGS: Gallbladder: Set gallbladder with sludge. Few stones as well. No wall thickening or adjacent fluid. No reported sonographic Murphy's sign Common bile duct: Diameter: 6 mm Liver: Diffusely echogenic hepatic parenchyma consistent with fatty liver infiltration. With this level of echogenicity evaluation for underlying mass lesion is limited. Portal vein is  patent on color Doppler imaging with normal direction of blood flow towards the liver. Other: None. IMPRESSION: Distended gallbladder with sludge and stones. No wall thickening or reported Murphy's sign. If there is further concern of acute cholecystitis a HIDA scan may be useful. The duct at 6 mm, upper limits of normal. This also could be evaluated further when clinically appropriate. Fatty liver infiltration. Known right renal collecting system dilatation.  Please correlate with prior CT Electronically Signed   By: Adrianna Horde M.D.   On: 03/22/2024 15:05    Scheduled Meds:  Chlorhexidine Gluconate Cloth  6 each Topical Daily   folic acid   1 mg Oral Daily   multivitamin with minerals  1 tablet Oral Daily   PHENObarbital  97.5 mg Intravenous Q8H   Followed by   Cecily Cohen ON 03/25/2024] PHENObarbital  65 mg Intravenous Q8H   Followed by   Cecily Cohen ON 03/27/2024] PHENObarbital  32.5 mg Intravenous Q8H   thiamine   100 mg Oral Q24H   Continuous Infusions:  lactated ringers  125 mL/hr at 03/24/24 0800   thiamine  (VITAMIN B1) injection       LOS: 2 days   Critical Care Procedure Note Authorized and Performed by: Olga Berthold MD  Total Critical Care time:  55 mins Due to a high probability of clinically significant, life threatening deterioration, the patient required my highest level of preparedness to intervene emergently and I personally spent this critical care time directly and personally managing the patient.  This critical care time included obtaining a history; examining the patient, pulse oximetry; ordering and review of studies; arranging urgent treatment with development of a management plan; evaluation of patient's response of treatment; frequent reassessment; and discussions with other providers.  This critical care time was performed to assess and manage the high probability of imminent and life threatening deterioration that could result in multi-organ failure.  It was exclusive of separately billable procedures and treating other patients and teaching time.    Faustino Hook, MD How to contact the [facility] Attending or Consulting provider 7A - 7P or covering provider during after hours 7P -7A, for this patient?  Check the care team in [facility] and look for a) attending/consulting [facility] provider listed and b) the [facility] team listed Log into [email or web address] to find provider on call.  Locate the [facility] provider you are looking for under [facility] and  page to a number that you can be directly reached. If you still have difficulty reaching the provider, please page the [facility] (Director on Call) for the Hospitalists listed on amion for assistance.  03/24/2024, 9:23 AM

## 2024-03-24 NOTE — Plan of Care (Signed)

## 2024-03-24 NOTE — Progress Notes (Signed)
 Much less agitated today; more lucid /communicative.  Remains quite tremulous. Denies abdominal pain  Viral markers negative.  Autoimmune markers pending.  vital signs in last 24 hours: Temp:  [96.1 F (35.6 C)-98.7 F (37.1 C)] 97.8 F (36.6 C) (05/11 1120) Pulse Rate:  [72-112] 102 (05/11 0900) Resp:  [17-35] 25 (05/11 0900) BP: (89-153)/(47-106) 111/62 (05/11 1120) SpO2:  [89 %-100 %] 94 % (05/11 0900) Last BM Date :  (PTA)  General:   Jaundiced.  Awake, talkative.  Quite tremulous.  She has no asterixis but tremulous.  Accompanied by her parents.  Abdomen: Nondistended.  Positive bowel sounds.  Soft and nontender without obvious mass organomegaly or ascites Intake/Output from previous day: 05/10 0701 - 05/11 0700 In: 4651.9 [P.O.:300; I.V.:4257.2; IV Piggyback:94.7] Out: 875 [Urine:875] Intake/Output this shift: Total I/O In: 654.5 [P.O.:120; I.V.:534.5] Out: -   Lab Results: Recent Labs    03/22/24 1338 03/23/24 0512 03/24/24 0556  WBC 3.3* 3.8* 4.1  HGB 13.0 11.1* 10.2*  HCT 36.3 31.3* 29.5*  PLT 36* 35* 45*   BMET Recent Labs    03/22/24 1338 03/23/24 0512 03/24/24 0556  NA 131* 135 137  K 3.3* 3.5 3.6  CL 92* 100 107  CO2 23 24 21*  GLUCOSE 219* 129* 135*  BUN 6 5* 5*  CREATININE 0.53 0.49 0.63  CALCIUM 8.8* 8.4* 8.0*   LFT Recent Labs    03/23/24 0512 03/24/24 0556  PROT 6.1* 5.1*  ALBUMIN 3.0* 2.5*  AST 295* 207*  ALT 171* 136*  ALKPHOS 222* 175*  BILITOT 22.3* 19.2*  BILIDIR 14.7*  --   IBILI 7.6*  --    PT/INR Recent Labs    03/22/24 1338 03/24/24 0556  LABPROT 15.8* 16.3*  INR 1.2 1.3*   Hepatitis Panel Recent Labs    03/22/24 1751  HEPBSAG NON REACTIVE  HCVAB NON REACTIVE  HEPAIGM NON REACTIVE  HEPBIGM NON REACTIVE   Impression: 33 year old lady with longstanding alcohol abuse polysubstance abuse admitted to the hospital 2 days ago with jaundice.  Significant withdrawal symptoms over the past 48 hours requiring an  aggressive medical regimen.  Clinically, withdrawal has improved.  Transaminases and bilirubin have also stabilized/improved.  Slight bump in INR at 1.3.  Viral markers negative.  Autoimmune and other markers pending.  Surprisingly, she was able to undergo an MRI which revealed no hepatic lesion, patent vasculature, nondilated biliary tree and a right UPJ obstruction of uncertain significance.  Imaging also suggests cholelithiasis with cholecystitis.  Clinically, patient does not have cholecystitis.  I suspect the inflammatory picture suggested on MRI is largely due to a bystander reaction secondary to adjacently inflammation.  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  Currently, she does not have fulminant liver failure.  She has had significant withdrawal symptoms.  Recommendations:  -Treatment largely supportive  -I provided a single dose of vitamin K IV today  -Track LFTs/INR  -Not an ideal prednisone candidate at this time; will withhold for now.  -Follow-up on pending serologies  -Further evaluation of right UPJ obstruction as deemed appropriate by hospitalist.

## 2024-03-25 ENCOUNTER — Inpatient Hospital Stay (HOSPITAL_COMMUNITY)

## 2024-03-25 DIAGNOSIS — F1090 Alcohol use, unspecified, uncomplicated: Secondary | ICD-10-CM | POA: Diagnosis not present

## 2024-03-25 DIAGNOSIS — F1010 Alcohol abuse, uncomplicated: Secondary | ICD-10-CM

## 2024-03-25 DIAGNOSIS — D696 Thrombocytopenia, unspecified: Secondary | ICD-10-CM | POA: Diagnosis not present

## 2024-03-25 DIAGNOSIS — E7220 Disorder of urea cycle metabolism, unspecified: Secondary | ICD-10-CM | POA: Diagnosis not present

## 2024-03-25 DIAGNOSIS — N135 Crossing vessel and stricture of ureter without hydronephrosis: Secondary | ICD-10-CM | POA: Diagnosis present

## 2024-03-25 DIAGNOSIS — R932 Abnormal findings on diagnostic imaging of liver and biliary tract: Secondary | ICD-10-CM

## 2024-03-25 DIAGNOSIS — K7010 Alcoholic hepatitis without ascites: Secondary | ICD-10-CM | POA: Diagnosis not present

## 2024-03-25 DIAGNOSIS — R7401 Elevation of levels of liver transaminase levels: Secondary | ICD-10-CM | POA: Diagnosis not present

## 2024-03-25 DIAGNOSIS — F1410 Cocaine abuse, uncomplicated: Secondary | ICD-10-CM | POA: Diagnosis not present

## 2024-03-25 LAB — COMPREHENSIVE METABOLIC PANEL WITH GFR
ALT: 121 U/L — ABNORMAL HIGH (ref 0–44)
AST: 170 U/L — ABNORMAL HIGH (ref 15–41)
Albumin: 2.6 g/dL — ABNORMAL LOW (ref 3.5–5.0)
Alkaline Phosphatase: 160 U/L — ABNORMAL HIGH (ref 38–126)
Anion gap: 11 (ref 5–15)
BUN: <5 mg/dL — ABNORMAL LOW (ref 6–20)
CO2: 28 mmol/L (ref 22–32)
Calcium: 8.6 mg/dL — ABNORMAL LOW (ref 8.9–10.3)
Chloride: 100 mmol/L (ref 98–111)
Creatinine, Ser: 0.48 mg/dL (ref 0.44–1.00)
GFR, Estimated: >60 mL/min (ref >60)
Glucose, Bld: 116 mg/dL — ABNORMAL HIGH (ref 70–99)
Potassium: 3.1 mmol/L — ABNORMAL LOW (ref 3.5–5.1)
Sodium: 139 mmol/L (ref 135–145)
Total Bilirubin: 20.6 mg/dL (ref 0.0–1.2)
Total Protein: 5.7 g/dL — ABNORMAL LOW (ref 6.5–8.1)

## 2024-03-25 LAB — MAGNESIUM: Magnesium: 1.6 mg/dL — ABNORMAL LOW (ref 1.7–2.4)

## 2024-03-25 LAB — PROTIME-INR
INR: 1.2 (ref 0.8–1.2)
Prothrombin Time: 15.7 s — ABNORMAL HIGH (ref 11.4–15.2)

## 2024-03-25 LAB — ANA W/REFLEX IF POSITIVE: Anti Nuclear Antibody (ANA): NEGATIVE

## 2024-03-25 LAB — AMMONIA: Ammonia: 72 umol/L — ABNORMAL HIGH (ref 9–35)

## 2024-03-25 LAB — CBC
HCT: 31.1 % — ABNORMAL LOW (ref 36.0–46.0)
Hemoglobin: 10.4 g/dL — ABNORMAL LOW (ref 12.0–15.0)
MCH: 31.8 pg (ref 26.0–34.0)
MCHC: 33.4 g/dL (ref 30.0–36.0)
MCV: 95.1 fL (ref 80.0–100.0)
Platelets: 55 10*3/uL — ABNORMAL LOW (ref 150–400)
RBC: 3.27 MIL/uL — ABNORMAL LOW (ref 3.87–5.11)
RDW: 21.7 % — ABNORMAL HIGH (ref 11.5–15.5)
WBC: 4.9 10*3/uL (ref 4.0–10.5)
nRBC: 0 % (ref 0.0–0.2)

## 2024-03-25 MED ORDER — LACTATED RINGERS IV SOLN: INTRAVENOUS | Status: AC

## 2024-03-25 MED ORDER — POTASSIUM CHLORIDE 10 MEQ/100ML IV SOLN
10.0000 meq | INTRAVENOUS | Status: AC
Start: 2024-03-25 — End: 2024-03-26
  Administered 2024-03-25 (×4): 10 meq via INTRAVENOUS
  Filled 2024-03-25 (×4): qty 100

## 2024-03-25 MED ORDER — MAGNESIUM SULFATE 4 GM/100ML IV SOLN
4.0000 g | Freq: Once | INTRAVENOUS | Status: AC
  Administered 2024-03-25: 4 g via INTRAVENOUS
  Filled 2024-03-25: qty 100

## 2024-03-25 MED ORDER — LACTULOSE 10 GM/15ML PO SOLN
20.0000 g | Freq: Every day | ORAL | Status: DC
  Administered 2024-03-25 – 2024-03-26 (×2): 20 g via ORAL
  Filled 2024-03-25 (×2): qty 30

## 2024-03-25 NOTE — Progress Notes (Addendum)
 PROGRESS NOTE   Stephanie Mercado  QQV:[id number] DOB: 1990-12-30 DOA: 03/22/2024 PCP: [name], FNP   Chief Complaint  Patient presents with   Jaundice   Level of care: Stepdown  Brief Admission History:  33 year old female alcoholic last drink about 3 days ago, polysubstance abuse, benzodiazepine abuse and addiction, ADHD on Adderall, history of opioid overdose, alcoholic hepatitis, hyperbilirubinemia with elevated LFTs, history of cocaine abuse, has had increasing confusion at home over the past few hours prior to arrival also notably having more jaundiced in the eyes and skin.  She had admitted to using cocaine Suboxone and Adderall recently.  Pt reportedly drinks Four Loko daily.  It is a flavored malt beverage known for high alcohol content, ranging from 8% to 14% alcohol by volume.  Her workup in the ED was positive for acute hepatic dysfunction with elevated LFTs and hyperbilirubinemia and severe thrombocytopenia with a platelet count of 36, bilirubin was 25.  Her hemoglobin was 13.  WBC 3.3.  Her acute hepatitis panel testing was negative.  INR was reassuring at 1.2.  Pt was admitted to ICU for acute alcohol withdrawal and subsequently developed delirium tremens.     Assessment and Plan:  Acute Alcohol Withdrawal  Delirium Tremens  - Patient presented early with very severe alcohol withdrawal symptoms severely agitated and after I started a Precedex  infusion to max dose she had no response to this in addition to doses of IV midazolam  and IV haloperidol .  I reached out to critical care team at [facility] and spoke with Dr. Katrine Parody who recommended phenobarbital protocol.  I called the Pharm.D. covering our ICU and requested to initiate the phenobarbital alcohol withdrawal protocol.  Continue CIWA.  Continue Haldol  as needed.  Continue neurochecks.  Continue IV fluid hydration and other supportive measures.  If patient is subsequently intubated Dr. Katrine Parody says patient would need to  transfer to [facility].  Foley placed due to critical illness.  - Pt seems to be responding favorably to the phenobarbital alcohol withdrawal protocol, pt has been less severely agitated, less combative and has been able to eat and drink and take some oral medications;  - Pt declines residential alcohol abuse treatment and says she will go home but agreeable to outpatient alcohol abuse treatment.   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  - obtain renal US  to further define - discussed with Dr. Claretta Croft recommends referral to outpatient urology at discharge   Alcoholic Hepatitis Hyperbilirubinemia Transaminitis Hyperammonemia - appreciate GI consultation, did not feel steroids needed at this time - further recommendations to follow - continue supportive measures  - ammonia up today at 77, will defer need for lactulose to GI team - discussed with GI team, MRI findings suggesting acute cholecystitis not felt to be clinically significant as she has no clinical symptoms of this and this is likely reactive changes from adjacent inflammation from liver disease  Severe thrombocytopenia  - platelets improved to 55  - secondary to liver disease  - avoiding heparins - no active bleeding found  DVT prophylaxis: SCDs Code Status: Full  Family Communication: bedsidefianceparents Disposition: remain in ICU    Consultants:  GI PCCM (telephone)  Procedures:   Antimicrobials:    Subjective: Pt  remains irritable and anxious but she is eating and drinking; she is cooperative with staff; she tells me she plans to go home at discharge and have outpatient alcohol treatment   Objective: Vitals:   03/25/24 0000 03/25/24 0144 03/25/24  0400 03/25/24 0727  BP:  138/85 (!) 141/99   Pulse: 92 99 100   Resp: (!) 23 (!) 22 (!) 26   Temp: 97.7 F (36.5 C)  98.5 F (36.9 C) 99.4 F (37.4 C)  TempSrc: Axillary   Oral  SpO2: 93% 95% 96%   Weight:      Height:        Intake/Output Summary (Last 24 hours) at 03/25/2024 1056 Last data filed at 03/25/2024 0600 Gross per 24 hour  Intake 2734.26 ml  Output --  Net 2734.26 ml   Filed Weights   03/22/24 1317 03/22/24 2008  Weight: 90.7 kg 92.3 kg   Examination:  General exam: much less agitation, delirium, confusion, combativeness than yesterday;  Respiratory system: Clear to auscultation. Respiratory effort normal. Cardiovascular system: normal S1 & S2 heard. No JVD, murmurs, rubs, gallops or clicks. No pedal edema. Gastrointestinal system: Abdomen is nondistended, soft and nontender. No organomegaly or masses felt. Normal bowel sounds heard. Central nervous system: Alert and oriented. No focal neurological deficits. Extremities: Symmetric 5 x 5 power. Skin: No rashes, lesions or ulcers. Psychiatry: Judgement and insight appear poor. Mood & affect severely anxious, delirious.   Data Reviewed: I have personally reviewed following labs and imaging studies  CBC: Recent Labs  Lab 03/22/24 1338 03/23/24 0512 03/24/24 0556 03/25/24 0549  WBC 3.3* 3.8* 4.1 4.9  NEUTROABS 2.2  --   --   --   HGB 13.0 11.1* 10.2* 10.4*  HCT 36.3 31.3* 29.5* 31.1*  MCV 93.1 92.1 96.1 95.1  PLT 36* 35* 45* 55*    Basic Metabolic Panel: Recent Labs  Lab 03/22/24 1338 03/23/24 0512 03/23/24 0853 03/24/24 0556 03/25/24 0549  NA 131* 135  --  137 139  K 3.3* 3.5  --  3.6 3.1*  CL 92* 100  --  107 100  CO2 23 24  --  21* 28  GLUCOSE 219* 129*  --  135* 116*  BUN 6 5*  --  5* <5*  CREATININE 0.53 0.49  --  0.63 0.48  CALCIUM 8.8* 8.4*  --  8.0* 8.6*  MG 1.4*  --  1.5* 1.9 1.6*  PHOS 2.4*  --   --   --   --     CBG: No results for input(s): "GLUCAP" in the last 168  hours.  Recent Results (from the past 240 hours)  MRSA Next Gen by PCR, Nasal     Status: None   Collection Time: 03/22/24  8:01 PM   Specimen: Nasal Mucosa; Nasal Swab  Result Value Ref Range Status   MRSA by PCR Next Gen NOT DETECTED NOT DETECTED Final    Comment: (NOTE) The GeneXpert MRSA Assay (FDA approved for NASAL specimens only), is one component of a comprehensive MRSA colonization surveillance program. It is not intended to diagnose MRSA infection nor to guide or monitor treatment for MRSA infections. Test performance is not FDA approved in patients less than 9 years old. Performed at [facility], [address] [place], [place] [address]      Radiology Studies: MR ABDOMEN MRCP W WO CONTAST Result Date: 03/23/2024 CLINICAL DATA:  Jaundiced. EXAM: MRI ABDOMEN WITHOUT AND WITH CONTRAST (INCLUDING MRCP) TECHNIQUE: Multiplanar multisequence MR imaging of  the abdomen was performed both before and after the administration of intravenous contrast. Heavily T2-weighted images of the biliary and pancreatic ducts were obtained, and three-dimensional MRCP images were rendered by post processing. CONTRAST:  9mL GADAVIST GADOBUTROL 1 MMOL/ML IV SOLN, 9mL GADAVIST GADOBUTROL 1 MMOL/ML IV SOLN COMPARISON:  Prior CT scan from 04/11/2023 and right upper quadrant ultrasound examination 03/22/2024 FINDINGS: Lower chest: The lung bases are grossly clear. No pulmonary lesions or pleural effusions. No pericardial effusion. Hepatobiliary: Moderate hepatomegaly. There is severe diffuse fatty infiltration of the liver but no worrisome hepatic lesions. No intrahepatic biliary dilatation. Normal caliber and course of the common bile duct. No common bile duct stones are identified. The gallbladder is abnormal. It demonstrates mild distension and there are numerous small dependently layering gallstones along with gallbladder sludge. There is also thickening of the gallbladder wall, pericholecystic fluid and  contrast enhancement of the gallbladder wall. Findings suggest acute cholecystitis. Pancreas: No mass, inflammatory changes, or other parenchymal abnormality identified. Spleen:  Borderline splenic enlargement.  No splenic lesions. Adrenals/Urinary Tract:  The adrenal glands are normal. 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 The left kidney is unremarkable. Stomach/Bowel: Visualized portions within the abdomen are unremarkable. Vascular/Lymphatic: The aorta and branch vessels are normal. The major venous structures are patent. No mesenteric or retroperitoneal lymphadenopathy. Other: No ascites. There is a small amount of pericholecystic fluid. No subcutaneous lesions. Musculoskeletal: Thoracolumbar scoliosis.  No bone lesions. IMPRESSION: 1. MR findings consistent with acute cholecystitis. 2. Normal caliber and course of the common bile duct. No common bile duct stones are identified. 3. Moderate hepatomegaly and severe diffuse fatty infiltration of the liver. 4. 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 Electronically Signed   By: Marrian Siva M.D.   On: 03/23/2024 21:38   MR 3D Recon At Scanner Result Date: 03/23/2024 CLINICAL DATA:  Jaundiced. EXAM: MRI ABDOMEN WITHOUT AND WITH CONTRAST (INCLUDING MRCP) TECHNIQUE: Multiplanar multisequence MR imaging of the abdomen was performed both before and after the administration of intravenous contrast. Heavily T2-weighted images of the biliary and pancreatic ducts were obtained, and three-dimensional MRCP images were rendered by post processing. CONTRAST:  9mL GADAVIST GADOBUTROL 1 MMOL/ML IV SOLN, 9mL GADAVIST GADOBUTROL 1 MMOL/ML IV SOLN COMPARISON:  Prior CT scan from 04/11/2023 and right upper quadrant ultrasound examination 03/22/2024 FINDINGS: Lower chest: The lung bases are grossly clear. No pulmonary lesions or pleural  effusions. No pericardial effusion. Hepatobiliary: Moderate hepatomegaly. There is severe diffuse fatty infiltration of the liver but no worrisome hepatic lesions. No intrahepatic biliary dilatation. Normal caliber and course of the common bile duct. No common bile duct stones are identified. The gallbladder is abnormal. It demonstrates mild distension and there are numerous small dependently layering gallstones along with gallbladder sludge. There is also thickening of the gallbladder wall, pericholecystic fluid and contrast enhancement of the gallbladder wall. Findings suggest acute cholecystitis. Pancreas: No mass, inflammatory changes, or other parenchymal abnormality identified. Spleen:  Borderline splenic enlargement.  No splenic lesions. Adrenals/Urinary Tract:  The adrenal glands are normal. 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 The left kidney is unremarkable. Stomach/Bowel: Visualized portions within the abdomen are unremarkable. Vascular/Lymphatic: The aorta and branch vessels are normal. The major venous structures are patent. No mesenteric or retroperitoneal lymphadenopathy. Other: No ascites. There is a small amount of pericholecystic fluid. No subcutaneous lesions. Musculoskeletal: Thoracolumbar scoliosis.  No bone lesions. IMPRESSION: 1. MR findings  consistent with acute cholecystitis. 2. Normal caliber and course of the common bile duct. No common bile duct stones are identified. 3. Moderate hepatomegaly and severe diffuse fatty infiltration of the liver. 4. 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 Electronically Signed   By: Marrian Siva M.D.   On: 03/23/2024 21:38    Scheduled Meds:  Chlorhexidine Gluconate Cloth  6 each Topical Daily   folic acid   1 mg Oral Daily   multivitamin with minerals  1 tablet Oral Daily   PHENObarbital  65 mg Intravenous Q8H    Followed by   Cecily Cohen ON 03/27/2024] PHENObarbital  32.5 mg Intravenous Q8H   thiamine   100 mg Oral Q24H   Continuous Infusions:  lactated ringers  50 mL/hr at 03/25/24 0813   potassium chloride  10 mEq (03/25/24 1049)   thiamine  (VITAMIN B1) injection       LOS: 3 days   Critical Care Procedure Note Authorized and Performed by: Olga Berthold MD  Total Critical Care time:  57 mins Due to a high probability of clinically significant, life threatening deterioration, the patient required my highest level of preparedness to intervene emergently and I personally spent this critical care time directly and personally managing the patient.  This critical care time included obtaining a history; examining the patient, pulse oximetry; ordering and review of studies; arranging urgent treatment with development of a management plan; evaluation of patient's response of treatment; frequent reassessment; and discussions with other providers.  This critical care time was performed to assess and manage the high probability of imminent and life threatening deterioration that could result in multi-organ failure.  It was exclusive of separately billable procedures and treating other patients and teaching time.    Faustino Hook, MD How to contact the [facility] Attending or Consulting provider 7A - 7P or covering provider during after hours 7P -7A, for this patient?  Check the care team in [facility] and look for a) attending/consulting [facility] provider listed and b) the [facility] team listed Log into [email or web address] to find provider on call.  Locate the [facility] provider you are looking for under [facility] and page to a number that you can be directly reached. If you still have difficulty reaching the provider, please page the [facility] (Director on Call) for the Hospitalists listed on amion for assistance.  03/25/2024, 10:56 AM

## 2024-03-25 NOTE — Plan of Care (Signed)

## 2024-03-25 NOTE — Progress Notes (Signed)
 Date and time results received: 03/25/24 0706 (use smartphrase ".now" to insert current time)  Test: total bilirubin Critical Value: 20.6  Name of Provider Notified: Dr. Lincoln Renshaw   Orders Received? Or Actions Taken?:

## 2024-03-25 NOTE — Progress Notes (Signed)
 Gastroenterology Progress Note     Patient ID: Stephanie Mercado; [id number]; 1991-09-22    Subjective   No abdominal pain, N/V, no mental status changes or confusion. Feels tired. States this hospitalization wore her out. No overt GI bleeding. Resting upon entering room but easily arousable.    Objective   Vital signs in last 24 hours Temp:  [97.7 F (36.5 C)-99.4 F (37.4 C)] 99.4 F (37.4 C) (05/12 0727) Pulse Rate:  [92-103] 100 (05/12 0400) Resp:  [20-27] 26 (05/12 0400) BP: (110-155)/(62-110) 141/99 (05/12 0400) SpO2:  [93 %-96 %] 96 % (05/12 0400) Last BM Date : 03/24/24  Physical Exam General:   resting with eyes closed, easily awakens Head:  Normocephalic and atraumatic. Eyes:  +scleral icterus Abdomen:  Bowel sounds present, round but non-tense, hepatomegaly noted, no TTP, rebound or guarding Neurologic:  oriented x4;  negative asterixis Skin:  Warm and dry, intact without significant lesions.  Cervical Nodes:  No significant cervical adenopathy. Psych:  Alert and cooperative. Normal mood and affect.  Intake/Output from previous day: 05/11 0701 - 05/12 0700 In: 3263.8 [P.O.:480; I.V.:2732.7; IV Piggyback:51.1] Out: -  Intake/Output this shift: No intake/output data recorded.  Lab Results  Recent Labs    03/23/24 0512 03/24/24 0556 03/25/24 0549  WBC 3.8* 4.1 4.9  HGB 11.1* 10.2* 10.4*  HCT 31.3* 29.5* 31.1*  PLT 35* 45* 55*   BMET Recent Labs    03/23/24 0512 03/24/24 0556 03/25/24 0549  NA 135 137 139  K 3.5 3.6 3.1*  CL 100 107 100  CO2 24 21* 28  GLUCOSE 129* 135* 116*  BUN 5* 5* <5*  CREATININE 0.49 0.63 0.48  CALCIUM 8.4* 8.0* 8.6*   LFT Recent Labs    03/22/24 1751 03/23/24 0512 03/24/24 0556 03/25/24 0549  PROT 6.8 6.1* 5.1* 5.7*  ALBUMIN 3.2* 3.0* 2.5* 2.6*  AST 359* 295* 207* 170*  ALT 199* 171* 136* 121*  ALKPHOS 204* 222* 175* 160*  BILITOT 24.7* 22.3* 19.2* 20.6*  BILIDIR 13.5* 14.7*  --   --   IBILI 11.2 7.6*  --    --    PT/INR Recent Labs    03/24/24 0556 03/25/24 0549  LABPROT 16.3* 15.7*  INR 1.3* 1.2   Hepatitis Panel Recent Labs    03/22/24 1751  HEPBSAG NON REACTIVE  HCVAB NON REACTIVE  HEPAIGM NON REACTIVE  HEPBIGM NON REACTIVE    Studies/Results MR ABDOMEN MRCP W WO CONTAST Result Date: 03/23/2024 CLINICAL DATA:  Jaundiced. EXAM: MRI ABDOMEN WITHOUT AND WITH CONTRAST (INCLUDING MRCP) TECHNIQUE: Multiplanar multisequence MR imaging of the abdomen was performed both before and after the administration of intravenous contrast. Heavily T2-weighted images of the biliary and pancreatic ducts were obtained, and three-dimensional MRCP images were rendered by post processing. CONTRAST:  9mL GADAVIST GADOBUTROL 1 MMOL/ML IV SOLN, 9mL GADAVIST GADOBUTROL 1 MMOL/ML IV SOLN COMPARISON:  Prior CT scan from 04/11/2023 and right upper quadrant ultrasound examination 03/22/2024 FINDINGS: Lower chest: The lung bases are grossly clear. No pulmonary lesions or pleural effusions. No pericardial effusion. Hepatobiliary: Moderate hepatomegaly. There is severe diffuse fatty infiltration of the liver but no worrisome hepatic lesions. No intrahepatic biliary dilatation. Normal caliber and course of the common bile duct. No common bile duct stones are identified. The gallbladder is abnormal. It demonstrates mild distension and there are numerous small dependently layering gallstones along with gallbladder sludge. There is also thickening of the gallbladder wall, pericholecystic fluid and contrast enhancement of the gallbladder wall.  Findings suggest acute cholecystitis. Pancreas: No mass, inflammatory changes, or other parenchymal abnormality identified. Spleen:  Borderline splenic enlargement.  No splenic lesions. Adrenals/Urinary Tract:  The adrenal glands are normal. 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 The left kidney is unremarkable. Stomach/Bowel:  Visualized portions within the abdomen are unremarkable. Vascular/Lymphatic: The aorta and branch vessels are normal. The major venous structures are patent. No mesenteric or retroperitoneal lymphadenopathy. Other: No ascites. There is a small amount of pericholecystic fluid. No subcutaneous lesions. Musculoskeletal: Thoracolumbar scoliosis.  No bone lesions. IMPRESSION: 1. MR findings consistent with acute cholecystitis. 2. Normal caliber and course of the common bile duct. No common bile duct stones are identified. 3. Moderate hepatomegaly and severe diffuse fatty infiltration of the liver. 4. 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 Electronically Signed   By: Marrian Siva M.D.   On: 03/23/2024 21:38   MR 3D Recon At Scanner Result Date: 03/23/2024 CLINICAL DATA:  Jaundiced. EXAM: MRI ABDOMEN WITHOUT AND WITH CONTRAST (INCLUDING MRCP) TECHNIQUE: Multiplanar multisequence MR imaging of the abdomen was performed both before and after the administration of intravenous contrast. Heavily T2-weighted images of the biliary and pancreatic ducts were obtained, and three-dimensional MRCP images were rendered by post processing. CONTRAST:  9mL GADAVIST GADOBUTROL 1 MMOL/ML IV SOLN, 9mL GADAVIST GADOBUTROL 1 MMOL/ML IV SOLN COMPARISON:  Prior CT scan from 04/11/2023 and right upper quadrant ultrasound examination 03/22/2024 FINDINGS: Lower chest: The lung bases are grossly clear. No pulmonary lesions or pleural effusions. No pericardial effusion. Hepatobiliary: Moderate hepatomegaly. There is severe diffuse fatty infiltration of the liver but no worrisome hepatic lesions. No intrahepatic biliary dilatation. Normal caliber and course of the common bile duct. No common bile duct stones are identified. The gallbladder is abnormal. It demonstrates mild distension and there are numerous small dependently layering gallstones along with gallbladder sludge. There  is also thickening of the gallbladder wall, pericholecystic fluid and contrast enhancement of the gallbladder wall. Findings suggest acute cholecystitis. Pancreas: No mass, inflammatory changes, or other parenchymal abnormality identified. Spleen:  Borderline splenic enlargement.  No splenic lesions. Adrenals/Urinary Tract:  The adrenal glands are normal. 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 The left kidney is unremarkable. Stomach/Bowel: Visualized portions within the abdomen are unremarkable. Vascular/Lymphatic: The aorta and branch vessels are normal. The major venous structures are patent. No mesenteric or retroperitoneal lymphadenopathy. Other: No ascites. There is a small amount of pericholecystic fluid. No subcutaneous lesions. Musculoskeletal: Thoracolumbar scoliosis.  No bone lesions. IMPRESSION: 1. MR findings consistent with acute cholecystitis. 2. Normal caliber and course of the common bile duct. No common bile duct stones are identified. 3. Moderate hepatomegaly and severe diffuse fatty infiltration of the liver. 4. 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 Electronically Signed   By: Marrian Siva M.D.   On: 03/23/2024 21:38   US  Abdomen Limited RUQ (LIVER/GB) Result Date: 03/22/2024 CLINICAL DATA:  Right upper quadrant pain EXAM: ULTRASOUND ABDOMEN LIMITED RIGHT UPPER QUADRANT COMPARISON:  Renal stone CT 04/03/2023 FINDINGS: Gallbladder: Set gallbladder with sludge. Few stones as well. No wall thickening or adjacent fluid. No reported sonographic Murphy's sign Common bile duct: Diameter: 6 mm Liver: Diffusely echogenic hepatic parenchyma consistent with fatty liver infiltration. With this level of echogenicity evaluation for underlying mass lesion is limited. Portal vein is patent on color Doppler imaging with normal direction of blood flow towards the liver.  Other: None. IMPRESSION: Distended gallbladder with sludge and stones. No wall thickening or reported Murphy's sign. If there is further concern of acute cholecystitis a HIDA scan may be useful. The duct at 6 mm, upper limits of normal. This also could be evaluated further when clinically appropriate. Fatty liver infiltration. Known right renal collecting system dilatation. Please correlate with prior CT Electronically Signed   By: Adrianna Horde M.D.   On: 03/22/2024 15:05    Assessment  33 y.o. female with a history of chronic alcohol and polysubstance abuse admitted with decompensated chronic liver disease in setting alcoholic hepatitis.   Decompensated liver disease: without acute liver failure at this moment. transaminases slowly improving, Tbili fluctuating but expect to have a lag effect in this clinical scenario. MCRP without obstruction. Thickening of gallbladder wall likely reactive effect and does not clinically fit acute cholecystitis. She remains without encephalopathy although ammonia fluctuating. Acute viral markers negative. AMA, ANA, ASMA negative. DF is 33 with PT control average of 13. Can consider prednisolone if worsening, otherwise monitor serially.    Elevated ammonia: without obvious encephalopathy. Negative asterixis. Will start lactulose and can titrate as needed.      Plan / Recommendations  Continue to follow LFTs, INR Continue supportive care, monitor for encephalopathy Start lactulose once daily Will need complete abstinence of ETOH going forward Will need further outpatient care for decompensated liver disease.  Holding on prednisolone at this time.      LOS: 3 days    03/25/2024, 11:10 AM  Delman Ferns, PhD, [facility] Gastroenterology

## 2024-03-26 ENCOUNTER — Inpatient Hospital Stay (HOSPITAL_COMMUNITY)

## 2024-03-26 DIAGNOSIS — F10139 Alcohol abuse with withdrawal, unspecified: Secondary | ICD-10-CM | POA: Diagnosis not present

## 2024-03-26 DIAGNOSIS — K7010 Alcoholic hepatitis without ascites: Secondary | ICD-10-CM | POA: Diagnosis not present

## 2024-03-26 DIAGNOSIS — K7689 Other specified diseases of liver: Principal | ICD-10-CM

## 2024-03-26 DIAGNOSIS — N135 Crossing vessel and stricture of ureter without hydronephrosis: Secondary | ICD-10-CM | POA: Diagnosis not present

## 2024-03-26 DIAGNOSIS — R509 Fever, unspecified: Secondary | ICD-10-CM | POA: Diagnosis not present

## 2024-03-26 DIAGNOSIS — F10931 Alcohol use, unspecified with withdrawal delirium: Secondary | ICD-10-CM | POA: Diagnosis present

## 2024-03-26 DIAGNOSIS — F1010 Alcohol abuse, uncomplicated: Secondary | ICD-10-CM

## 2024-03-26 DIAGNOSIS — R7401 Elevation of levels of liver transaminase levels: Secondary | ICD-10-CM | POA: Diagnosis not present

## 2024-03-26 LAB — CBC
HCT: 30.6 % — ABNORMAL LOW (ref 36.0–46.0)
Hemoglobin: 10.8 g/dL — ABNORMAL LOW (ref 12.0–15.0)
MCH: 33 pg (ref 26.0–34.0)
MCHC: 35.3 g/dL (ref 30.0–36.0)
MCV: 93.6 fL (ref 80.0–100.0)
Platelets: 73 10*3/uL — ABNORMAL LOW (ref 150–400)
RBC: 3.27 MIL/uL — ABNORMAL LOW (ref 3.87–5.11)
RDW: 23 % — ABNORMAL HIGH (ref 11.5–15.5)
WBC: 5.9 10*3/uL (ref 4.0–10.5)
nRBC: 0.3 % — ABNORMAL HIGH (ref 0.0–0.2)

## 2024-03-26 LAB — PROTIME-INR
INR: 1.3 — ABNORMAL HIGH (ref 0.8–1.2)
Prothrombin Time: 16 s — ABNORMAL HIGH (ref 11.4–15.2)

## 2024-03-26 LAB — COMPREHENSIVE METABOLIC PANEL WITH GFR
ALT: 102 U/L — ABNORMAL HIGH (ref 0–44)
AST: 138 U/L — ABNORMAL HIGH (ref 15–41)
Albumin: 2.5 g/dL — ABNORMAL LOW (ref 3.5–5.0)
Alkaline Phosphatase: 154 U/L — ABNORMAL HIGH (ref 38–126)
Anion gap: 5 (ref 5–15)
BUN: <5 mg/dL — ABNORMAL LOW (ref 6–20)
CO2: 32 mmol/L (ref 22–32)
Calcium: 8 mg/dL — ABNORMAL LOW (ref 8.9–10.3)
Chloride: 100 mmol/L (ref 98–111)
Creatinine, Ser: 0.78 mg/dL (ref 0.44–1.00)
GFR, Estimated: >60 mL/min (ref >60)
Glucose, Bld: 119 mg/dL — ABNORMAL HIGH (ref 70–99)
Potassium: 4.2 mmol/L (ref 3.5–5.1)
Sodium: 137 mmol/L (ref 135–145)
Total Bilirubin: 19.7 mg/dL (ref 0.0–1.2)
Total Protein: 5.7 g/dL — ABNORMAL LOW (ref 6.5–8.1)

## 2024-03-26 LAB — MAGNESIUM: Magnesium: 2 mg/dL (ref 1.7–2.4)

## 2024-03-26 LAB — URINALYSIS, ROUTINE W REFLEX MICROSCOPIC
Glucose, UA: NEGATIVE mg/dL
Hgb urine dipstick: NEGATIVE
Ketones, ur: NEGATIVE mg/dL
Leukocytes,Ua: NEGATIVE
Nitrite: NEGATIVE
Protein, ur: NEGATIVE mg/dL
Specific Gravity, Urine: 1.012 (ref 1.005–1.030)
pH: 7 (ref 5.0–8.0)

## 2024-03-26 LAB — AMMONIA: Ammonia: 118 umol/L — ABNORMAL HIGH (ref 9–35)

## 2024-03-26 MED ORDER — LACTULOSE 10 GM/15ML PO SOLN
20.0000 g | Freq: Two times a day (BID) | ORAL | Status: DC
  Administered 2024-03-26 – 2024-03-27 (×2): 20 g via ORAL
  Filled 2024-03-26 (×2): qty 30

## 2024-03-26 NOTE — Progress Notes (Signed)
 Subjective: Reports she feels good.No abdominal pain, nausea, vomiting, abdominal distension, LE edema. Had 1 BM yesterday without brbpr or melena. States this was the first BM in 5-6 days. Has chronic constipation. Hadn't had a BM yet today.   States she is done drinking alcohol. Plans to go to AA when she is discharged. States he mom already goes, so she is going to go along with her. States she already has meetings set up.   Objective: Vital signs in last 24 hours: Temp:  [98 F (36.7 C)-101 F (38.3 C)] 101 F (38.3 C) (05/13 0749) Pulse Rate:  [97-105] 98 (05/13 0800) Resp:  [19-28] 25 (05/13 1006) BP: (116-142)/(67-82) 116/68 (05/13 0800) SpO2:  [92 %-95 %] 92 % (05/13 0800) Last BM Date : 03/24/24 General:   Alert and oriented, pleasant Head:  Normocephalic and atraumatic. Eyes:  No icterus, sclera clear. Conjuctiva pink.  Mouth:  Without lesions, mucosa pink and moist.  Neck:  Supple, without thyromegaly or masses.  Heart:  S1, S2 present, no murmurs noted.  Lungs: Clear to auscultation bilaterally, without wheezing, rales, or rhonchi.  Abdomen:  Bowel sounds present, soft, non-tender, non-distended. No HSM or hernias noted. No rebound or guarding. No masses appreciated  Msk:  Symmetrical without gross deformities. Normal posture. Pulses:  Normal pulses noted. Extremities:  Without clubbing or edema. Neurologic:  Alert and  oriented x4;  grossly normal neurologically. Skin:  Warm and dry, intact without significant lesions.  Cervical Nodes:  No significant cervical adenopathy. Psych:  Alert and cooperative. Normal mood and affect.  Intake/Output from previous day: 05/12 0701 - 05/13 0700 In: 430.6 [I.V.:430.6] Out: -  Intake/Output this shift: Total I/O In: 1012.4 [P.O.:240; I.V.:772.4] Out: -   Lab Results: Recent Labs    03/24/24 0556 03/25/24 0549 03/26/24 0525  WBC 4.1 4.9 5.9  HGB 10.2* 10.4* 10.8*  HCT 29.5* 31.1* 30.6*  PLT 45* 55* 73*    BMET Recent Labs    03/24/24 0556 03/25/24 0549 03/26/24 0525  NA 137 139 137  K 3.6 3.1* 4.2  CL 107 100 100  CO2 21* 28 32  GLUCOSE 135* 116* 119*  BUN 5* <5* <5*  CREATININE 0.63 0.48 0.78  CALCIUM 8.0* 8.6* 8.0*   LFT Recent Labs    03/24/24 0556 03/25/24 0549 03/26/24 0525  PROT 5.1* 5.7* 5.7*  ALBUMIN 2.5* 2.6* 2.5*  AST 207* 170* 138*  ALT 136* 121* 102*  ALKPHOS 175* 160* 154*  BILITOT 19.2* 20.6* 19.7*   PT/INR Recent Labs    03/25/24 0549 03/26/24 0525  LABPROT 15.7* 16.0*  INR 1.2 1.3*    Studies/Results: US  RENAL Result Date: 03/25/2024 CLINICAL DATA:  Right-sided ureteral obstruction and hydronephrosis EXAM: RENAL / URINARY TRACT ULTRASOUND COMPLETE COMPARISON:  MRI abdomen Mar 22, 2024 renal ultrasound Mar 22, 2024 CT renal Apr 11, 2023 FINDINGS: Right Kidney: Renal measurements: 11.5 x 4.5 x 5.1 cm = volume: 140 mL. Moderate right hydronephrosis with cortical thinning correlate with the prior imaging. Left Kidney: Renal measurements: 13.8 x 6.2 x 5.0 = volume: 224.8 mL. Echogenicity within normal limits. No mass or hydronephrosis visualized. Bladder: Prevoid 208 cc, postvoid 2.5 Other: Echogenic liver correlate with fatty liver changes IMPRESSION: *Moderate right hydronephrosis with cortical thinning correlate with the prior imaging. *Echogenic liver correlate with fatty liver changes. Electronically Signed   By: Fredrich Jefferson M.D.   On: 03/25/2024 13:52    Assessment: 33 y.o. female with a history of chronic alcohol  and polysubstance abuse admitted with alcohol withdrawal, transaminitis, hyperbilirubinemia, alcoholic hepatitis.  Transaminitis/hyperbilirubinemia/Alcoholic hepatitis:  Transaminitis/hyperbilirubinemia likely secondary to alcoholic hepatitis.  LFTs slowly improving, Tbili fluctuating in 19-20 range, but suspect this will lag behind improvement in transaminases. MCRP without obstruction. Thickening of gallbladder wall likely reactive effect  and does not clinically fit acute cholecystitis as she has had no abdominal pain. Acute viral markers negative. AMA, ANA, ASMA negative. She remains without encephalopathy though ammonia is increasing. DF stable today at 33.5 with PT control of 13. MELD 3.0 is 25. It has been recommended to hold off on starting prednisolone unless worsening. Will continue to monitor.    Hyperammoniemia:  Without HE and no asterixis. Likely related to acute illness with chronic liver disease. Will increase lactulose to help with constipation. Do not necessarily need to follow ammonia at this point. Can recheck if mental status changes, but at this point, will try to get patient's bowels moving appropriately and monitor for clinically.    Plan: Increase lactulose to 20 g twice daily.  Monitor for development of hepatic encephalopathy. Continue to follow LFTs, INR  Continue supportive measures. Needs strict alcohol abstinence going forward.  Patient states she is dedicated to this and plans to attend AA meetings. Will need close outpatient follow-up regarding liver disease. Holding on prednisolone at this time unless worsening.    LOS: 4 days    03/26/2024, 10:57 AM   Shana Daring, PA-C [facility]

## 2024-03-26 NOTE — Progress Notes (Addendum)
 PROGRESS NOTE   Stephanie Mercado  [name]:[id number] DOB: 29-Mar-1991 DOA: 03/22/2024 PCP: [name], FNP   Chief Complaint  Patient presents with   Jaundice   Level of care: Stepdown  Brief Admission History:  33 year old female alcoholic last drink about 3 days ago, polysubstance abuse, benzodiazepine abuse and addiction, ADHD on Adderall, history of opioid overdose, alcoholic hepatitis, hyperbilirubinemia with elevated LFTs, history of cocaine abuse, has had increasing confusion at home over the past few hours prior to arrival also notably having more jaundiced in the eyes and skin.  She had admitted to using cocaine Suboxone and Adderall recently.  Pt reportedly drinks Four Loko daily.  It is a flavored malt beverage known for high alcohol content, ranging from 8% to 14% alcohol by volume.  Her workup in the ED was positive for acute hepatic dysfunction with elevated LFTs and hyperbilirubinemia and severe thrombocytopenia with a platelet count of 36, bilirubin was 25.  Her hemoglobin was 13.  WBC 3.3.  Her acute hepatitis panel testing was negative.  INR was reassuring at 1.2.  Pt was admitted to ICU for acute alcohol withdrawal and subsequently developed delirium tremens.     Assessment and Plan:  Acute Alcohol Withdrawal  Delirium Tremens - Improving  - Patient presented early with very severe alcohol withdrawal symptoms severely agitated and after I started a Precedex  infusion to max dose she had no response to this in addition to doses of IV midazolam  and IV haloperidol .  I reached out to critical care team at [facility] and spoke with Dr. Katrine Parody who recommended phenobarbital protocol.  I called the Pharm.D. covering our ICU and requested to initiate the phenobarbital alcohol withdrawal protocol.  Continue CIWA.  Continue Haldol  as needed.  Continue neurochecks.  Continue IV fluid hydration and other supportive measures.  If patient is subsequently intubated Dr. Katrine Parody says patient would  need to transfer to [facility].  Foley placed due to critical illness.  - Pt seems to be responding favorably to the phenobarbital alcohol withdrawal protocol, pt has been less severely agitated, less combative and has been able to eat and drink and take some oral medications;  - Pt declines residential alcohol abuse treatment and says she will go home but agreeable to outpatient alcohol abuse treatment.  - Pt should be completing the phenobarbital withdrawal protocol on 5/14 and could subsequently discharge home afterwards if medically stable.   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  - obtain renal US  to further define - discussed with Dr. Claretta Croft (urologist) he recommends referral to outpatient urology at discharge   Alcoholic Hepatitis Hyperbilirubinemia Transaminitis Hyperammonemia - appreciate GI consultation, did not feel steroids needed at this time - further recommendations to follow - continue supportive measures  - ammonia up today at 118,  lactulose started by GI team - discussed with GI team, MRI findings suggesting acute cholecystitis not felt to be clinically significant as she has no clinical symptoms of this and this is likely reactive changes from adjacent inflammation from liver disease  Severe thrombocytopenia  - platelets improved to 73  - secondary to liver disease  - avoiding heparins - no active bleeding found  Fever - cause unknown - check urinalysis and CXR -  pt denies dysuria symptoms - treat supportively for now  DVT prophylaxis: SCDs Code Status: Full  Family Communication: bedsidefianceparents Disposition: transfer to telemetry    Consultants:  GI PCCM (telephone)  Procedures:    Antimicrobials:    Subjective: Pt eager to discharge home, says she is feeling better, motivated to go to outpatient AA treatment once discharged  Objective: Vitals:   03/26/24 0800 03/26/24 1006 03/26/24 1145 03/26/24 1200  BP: 116/68   129/78  Pulse: 98     Resp: (!) 25 (!) 25  (!) 24  Temp:   98 F (36.7 C)   TempSrc:   Oral   SpO2: 92%     Weight:      Height:        Intake/Output Summary (Last 24 hours) at 03/26/2024 1213 Last data filed at 03/26/2024 0918 Gross per 24 hour  Intake 1442.95 ml  Output --  Net 1442.95 ml   Filed Weights   03/22/24 1317 03/22/24 2008  Weight: 90.7 kg 92.3 kg   Examination:  General exam: much less agitation, delirium, confusion, combativeness, very minimal tremulousness;  Respiratory system: Clear to auscultation. Respiratory effort normal. Cardiovascular system: normal S1 & S2 heard. No JVD, murmurs, rubs, gallops or clicks. No pedal edema. Gastrointestinal system: Abdomen is nondistended, soft and nontender. No organomegaly or masses felt. Normal bowel sounds heard. Central nervous system: Alert and oriented. No focal neurological deficits. Extremities: Symmetric 5 x 5 power. Skin: No rashes, lesions or ulcers. Psychiatry: Judgement and insight appear poor. Mood & affect anxious.   Data Reviewed: I have personally reviewed following labs and imaging studies  CBC: Recent Labs  Lab 03/22/24 1338 03/23/24 0512 03/24/24 0556 03/25/24 0549 03/26/24 0525  WBC 3.3* 3.8* 4.1 4.9 5.9  NEUTROABS 2.2  --   --   --   --   HGB 13.0 11.1* 10.2* 10.4* 10.8*  HCT 36.3 31.3* 29.5* 31.1* 30.6*  MCV 93.1 92.1 96.1 95.1 93.6  PLT 36* 35* 45* 55* 73*    Basic Metabolic Panel: Recent Labs  Lab 03/22/24 1338 03/23/24 0512 03/23/24 0853 03/24/24 0556 03/25/24 0549 03/26/24 0525  NA 131* 135  --  137 139 137  K 3.3* 3.5  --  3.6 3.1* 4.2  CL 92* 100  --  107 100 100  CO2 23 24  --  21* 28 32  GLUCOSE 219* 129*  --  135* 116* 119*   BUN 6 5*  --  5* <5* <5*  CREATININE 0.53 0.49  --  0.63 0.48 0.78  CALCIUM 8.8* 8.4*  --  8.0* 8.6* 8.0*  MG 1.4*  --  1.5* 1.9 1.6* 2.0  PHOS 2.4*  --   --   --   --   --     CBG: No results for input(s): "GLUCAP" in the last 168 hours.  Recent Results (from the past 240 hours)  MRSA Next Gen by PCR, Nasal     Status: None   Collection Time: 03/22/24  8:01 PM   Specimen: Nasal Mucosa; Nasal Swab  Result Value Ref Range Status   MRSA by PCR Next Gen NOT DETECTED NOT DETECTED Final    Comment: (NOTE) The GeneXpert MRSA Assay (FDA approved for NASAL specimens only), is one component of a comprehensive MRSA colonization surveillance program. It is not intended to diagnose MRSA infection nor to guide or monitor treatment for MRSA infections. Test performance is not FDA approved in patients less than 94 years old. Performed  at [facility], [address] [place], [place] [address]      Radiology Studies: US  RENAL Result Date: 03/25/2024 CLINICAL DATA:  Right-sided ureteral obstruction and hydronephrosis EXAM: RENAL / URINARY TRACT ULTRASOUND COMPLETE COMPARISON:  MRI abdomen Mar 22, 2024 renal ultrasound Mar 22, 2024 CT renal Apr 11, 2023 FINDINGS: Right Kidney: Renal measurements: 11.5 x 4.5 x 5.1 cm = volume: 140 mL. Moderate right hydronephrosis with cortical thinning correlate with the prior imaging. Left Kidney: Renal measurements: 13.8 x 6.2 x 5.0 = volume: 224.8 mL. Echogenicity within normal limits. No mass or hydronephrosis visualized. Bladder: Prevoid 208 cc, postvoid 2.5 Other: Echogenic liver correlate with fatty liver changes IMPRESSION: *Moderate right hydronephrosis with cortical thinning correlate with the prior imaging. *Echogenic liver correlate with fatty liver changes. Electronically Signed   By: Fredrich Jefferson M.D.   On: 03/25/2024 13:52    Scheduled Meds:  Chlorhexidine Gluconate Cloth  6 each Topical Daily   folic acid   1 mg Oral Daily   lactulose  20 g Oral Daily    multivitamin with minerals  1 tablet Oral Daily   PHENObarbital  65 mg Intravenous Q8H   Followed by   Cecily Cohen ON 03/27/2024] PHENObarbital  32.5 mg Intravenous Q8H   thiamine   100 mg Oral Q24H   Continuous Infusions:  thiamine  (VITAMIN B1) injection       LOS: 4 days   Time spent: 56 mins  Linh Johannes Lincoln Renshaw, MD How to contact the [facility] Attending or Consulting provider 7A - 7P or covering provider during after hours 7P -7A, for this patient?  Check the care team in [facility] and look for a) attending/consulting [facility] provider listed and b) the [facility] team listed Log into [email or web address] to find provider on call.  Locate the [facility] provider you are looking for under [facility] and page to a number that you can be directly reached. If you still have difficulty reaching the provider, please page the [facility] (Director on Call) for the Hospitalists listed on amion for assistance.  03/26/2024, 12:13 PM

## 2024-03-26 NOTE — Plan of Care (Signed)
   Problem: Activity: Goal: Risk for activity intolerance will decrease Outcome: Progressing   Problem: Coping: Goal: Level of anxiety will decrease Outcome: Progressing

## 2024-03-27 ENCOUNTER — Telehealth (INDEPENDENT_AMBULATORY_CARE_PROVIDER_SITE_OTHER): Payer: Self-pay | Admitting: Gastroenterology

## 2024-03-27 DIAGNOSIS — K7689 Other specified diseases of liver: Secondary | ICD-10-CM | POA: Diagnosis not present

## 2024-03-27 DIAGNOSIS — E7220 Disorder of urea cycle metabolism, unspecified: Secondary | ICD-10-CM | POA: Diagnosis not present

## 2024-03-27 DIAGNOSIS — K7010 Alcoholic hepatitis without ascites: Secondary | ICD-10-CM | POA: Diagnosis not present

## 2024-03-27 DIAGNOSIS — R7401 Elevation of levels of liver transaminase levels: Secondary | ICD-10-CM | POA: Diagnosis not present

## 2024-03-27 DIAGNOSIS — D696 Thrombocytopenia, unspecified: Secondary | ICD-10-CM | POA: Diagnosis not present

## 2024-03-27 DIAGNOSIS — F10931 Alcohol use, unspecified with withdrawal delirium: Secondary | ICD-10-CM | POA: Diagnosis not present

## 2024-03-27 LAB — COMPREHENSIVE METABOLIC PANEL WITH GFR
ALT: 92 U/L — ABNORMAL HIGH (ref 0–44)
AST: 127 U/L — ABNORMAL HIGH (ref 15–41)
Albumin: 2.7 g/dL — ABNORMAL LOW (ref 3.5–5.0)
Alkaline Phosphatase: 134 U/L — ABNORMAL HIGH (ref 38–126)
Anion gap: 9 (ref 5–15)
BUN: <5 mg/dL — ABNORMAL LOW (ref 6–20)
CO2: 24 mmol/L (ref 22–32)
Calcium: 8.2 mg/dL — ABNORMAL LOW (ref 8.9–10.3)
Chloride: 101 mmol/L (ref 98–111)
Creatinine, Ser: 0.78 mg/dL (ref 0.44–1.00)
GFR, Estimated: >60 mL/min (ref >60)
Glucose, Bld: 104 mg/dL — ABNORMAL HIGH (ref 70–99)
Potassium: 3.5 mmol/L (ref 3.5–5.1)
Sodium: 134 mmol/L — ABNORMAL LOW (ref 135–145)
Total Bilirubin: 21.2 mg/dL (ref 0.0–1.2)
Total Protein: 6.3 g/dL — ABNORMAL LOW (ref 6.5–8.1)

## 2024-03-27 LAB — CBC
HCT: 32.6 % — ABNORMAL LOW (ref 36.0–46.0)
Hemoglobin: 11.2 g/dL — ABNORMAL LOW (ref 12.0–15.0)
MCH: 32.1 pg (ref 26.0–34.0)
MCHC: 34.4 g/dL (ref 30.0–36.0)
MCV: 93.4 fL (ref 80.0–100.0)
Platelets: 96 10*3/uL — ABNORMAL LOW (ref 150–400)
RBC: 3.49 MIL/uL — ABNORMAL LOW (ref 3.87–5.11)
RDW: 23.1 % — ABNORMAL HIGH (ref 11.5–15.5)
WBC: 7.8 10*3/uL (ref 4.0–10.5)
nRBC: 0.3 % — ABNORMAL HIGH (ref 0.0–0.2)

## 2024-03-27 LAB — PROTIME-INR
INR: 1.2 (ref 0.8–1.2)
Prothrombin Time: 15.8 s — ABNORMAL HIGH (ref 11.4–15.2)

## 2024-03-27 LAB — MAGNESIUM: Magnesium: 2 mg/dL (ref 1.7–2.4)

## 2024-03-27 MED ORDER — LACTULOSE 10 GM/15ML PO SOLN: 20.0000 g | Freq: Two times a day (BID) | ORAL | 1 refills | Status: AC

## 2024-03-27 MED ORDER — PHENOBARBITAL 32.4 MG PO TABS
32.4000 mg | ORAL_TABLET | Freq: Three times a day (TID) | ORAL | Status: DC
  Administered 2024-03-27: 32.4 mg via ORAL
  Filled 2024-03-27: qty 1

## 2024-03-27 NOTE — Consult Note (Signed)
 [facility] Consult  Reason for Consult: Acute cholecystitis called of MRCP Referring Physician: Shana Daring, PA-C  Chief Complaint   Jaundice     HPI: Stephanie Mercado is a 33 y.o. female who was admitted to the hospital with withdrawal symptoms.  She has a past medical history significant for alcohol abuse, migraines, previous overdoses, ADHD, and drug use.  She stopped drinking 2 days prior to presenting to the emergency department.  She presented secondary to jaundiced skin which started 3 to 4 days prior to admission.  She was evaluated by GI who was treating her for alcoholic hepatitis.  She was initially noted to have transaminitis and hyperbilirubinemia with total bilirubin in the 20s.  She underwent a right upper quadrant abdominal ultrasound which demonstrated gallbladder sludge and stones without wall thickening.  Given her elevated bilirubin, MRCP was obtained.  MRCP obtained on 5/10 demonstrated findings concerning for acute cholecystitis, no evidence of choledocholithiasis, moderate hepatomegaly and severe diffuse fatty infiltration of the liver, and borderline splenic enlargement.  Patient has been tolerating a diet throughout her hospital stay without nausea and vomiting.  She denies any abdominal pain.  General surgery was consulted given the MRCP findings.  She denies use of blood thinning medications and denies any history of abdominal surgeries.  Upon evaluation this morning, the patient is resting comfortably in bed.  She denies any abdominal pain.  She is tolerating a diet without nausea and vomiting.  States that she plans to not consume any more alcohol, is going to attend AA meetings, and will try to find new hobbies.  Past Medical History:  Diagnosis Date   ADHD    Alcohol withdrawal delirium, acute, hyperactive (HCC) 02/05/2018   Allergy    Complicated UTI (urinary tract infection) 04/12/2023   Delirium 02/05/2018   Eczema    Intentional overdose of  drug in tablet form (HCC)    Migraines    Overdose 02/05/2018   Sepsis (HCC) 04/12/2023    Past Surgical History:  Procedure Laterality Date   TONSILLECTOMY     WISDOM TOOTH EXTRACTION      Family History  Problem Relation Age of Onset   Cancer Mother    Kidney disease Mother    Hypertension Father    Diabetes Paternal Grandfather     Social History   Tobacco Use   Smoking status: Never   Smokeless tobacco: Never  Vaping Use   Vaping status: Never Used  Substance Use Topics   Alcohol use: Yes    Comment: occas   Drug use: Not Currently    Types: Marijuana    Comment: a few days ago    Medications: I have reviewed the patient's current medications.  Allergies  Allergen Reactions   Penicillins Hives    Immediate rash, facial/tongue/throat swelling, SOB or lightheadedness with hypotension   Rocephin  [Ceftriaxone ] Other (See Comments)    Muscle cramps in back (lasted about 8 hrs)     ROS:  Pertinent items are noted in HPI.  Blood pressure (!) 141/104, pulse 88, temperature 99.2 F (37.3 C), temperature source Oral, resp. rate 18, height 5\' 7"  (1.702 m), weight 92.3 kg, last menstrual period 02/24/2024, SpO2 98%. Physical Exam Vitals reviewed.  Constitutional:      Appearance: Normal appearance.  HENT:     Head: Normocephalic and atraumatic.  Eyes:     Extraocular Movements: Extraocular movements intact.     Pupils: Pupils are equal, round, and reactive to light.  Cardiovascular:  Rate and Rhythm: Normal rate.  Pulmonary:     Effort: Pulmonary effort is normal.  Abdominal:     Comments: Abdomen soft, nondistended, no percussion tenderness, nontender to palpation; no rigidity, guarding, rebound tenderness; negative Murphy sign  Musculoskeletal:        General: Normal range of motion.     Cervical back: Normal range of motion.  Skin:    General: Skin is warm and dry.  Neurological:     General: No focal deficit present.     Mental Status: She is  alert and oriented to person, place, and time.  Psychiatric:        Mood and Affect: Mood normal.        Behavior: Behavior normal.     Results: Results for orders placed or performed during the hospital encounter of 03/22/24 (from the past 48 hours)  Protime-INR     Status: Abnormal   Collection Time: 03/26/24  5:25 AM  Result Value Ref Range   Prothrombin Time 16.0 (H) 11.4 - 15.2 seconds   INR 1.3 (H) 0.8 - 1.2    Comment: (NOTE) INR goal varies based on device and disease states. Performed at [facility], [address] [place], [place] [address]   Ammonia     Status: Abnormal   Collection Time: 03/26/24  5:25 AM  Result Value Ref Range   Ammonia 118 (H) 9 - 35 umol/L    Comment: Performed at [facility], [address], [place], [place] [address]  Magnesium      Status: None   Collection Time: 03/26/24  5:25 AM  Result Value Ref Range   Magnesium  2.0 1.7 - 2.4 mg/dL    Comment: Performed at [facility], [address] [place], [place] [address]  CBC     Status: Abnormal   Collection Time: 03/26/24  5:25 AM  Result Value Ref Range   WBC 5.9 4.0 - 10.5 K/uL   RBC 3.27 (L) 3.87 - 5.11 MIL/uL   Hemoglobin 10.8 (L) 12.0 - 15.0 g/dL   HCT 46.9 (L) 62.9 - 52.8 %   MCV 93.6 80.0 - 100.0 fL   MCH 33.0 26.0 - 34.0 pg   MCHC 35.3 30.0 - 36.0 g/dL   RDW 41.3 (H) 24.4 - 01.0 %   Platelets 73 (L) 150 - 400 K/uL    Comment: SPECIMEN CHECKED FOR CLOTS Immature Platelet Fraction may be clinically indicated, consider ordering this additional test [id number] CONSISTENT WITH PREVIOUS RESULT    nRBC 0.3 (H) 0.0 - 0.2 %    Comment: Performed at [facility], [address] [place], [place] [address]  Comprehensive metabolic panel with GFR     Status: Abnormal   Collection Time: 03/26/24  5:25 AM  Result Value Ref Range   Sodium 137 135 - 145 mmol/L   Potassium 4.2 3.5 - 5.1 mmol/L   Chloride 100 98 - 111 mmol/L   CO2 32 22 - 32 mmol/L   Glucose, Bld 119 (H) 70 - 99  mg/dL    Comment: Glucose reference range applies only to samples taken after fasting for at least 8 hours.   BUN <5 (L) 6 - 20 mg/dL   Creatinine, Ser 4.25 0.44 - 1.00 mg/dL   Calcium 8.0 (L) 8.9 - 10.3 mg/dL   Total Protein 5.7 (L) 6.5 - 8.1 g/dL   Albumin 2.5 (L) 3.5 - 5.0 g/dL   AST 956 (H) 15 - 41 U/L   ALT  102 (H) 0 - 44 U/L   Alkaline Phosphatase 154 (H) 38 - 126 U/L   Total Bilirubin 19.7 (HH) 0.0 - 1.2 mg/dL    Comment: CRITICAL VALUE NOTED. VALUE IS CONSISTENT WITH PREVIOUSLY REPORTED/CALLED VALUE   GFR, Estimated >60 >60 mL/min    Comment: (NOTE) Calculated using the CKD-EPI Creatinine Equation (2021)    Anion gap 5 5 - 15    Comment: Performed at [facility], [address] [place], [place] [address]  Urinalysis, Routine w reflex microscopic -Urine, Clean Catch     Status: Abnormal   Collection Time: 03/26/24  8:50 AM  Result Value Ref Range   Color, Urine AMBER (A) YELLOW    Comment: BIOCHEMICALS MAY BE AFFECTED BY COLOR   APPearance CLEAR CLEAR   Specific Gravity, Urine 1.012 1.005 - 1.030   pH 7.0 5.0 - 8.0   Glucose, UA NEGATIVE NEGATIVE mg/dL   Hgb urine dipstick NEGATIVE NEGATIVE   Bilirubin Urine MODERATE (A) NEGATIVE   Ketones, ur NEGATIVE NEGATIVE mg/dL   Protein, ur NEGATIVE NEGATIVE mg/dL   Nitrite NEGATIVE NEGATIVE   Leukocytes,Ua NEGATIVE NEGATIVE    Comment: Performed at [facility], [address], [place], [place] [address]  Protime-INR     Status: Abnormal   Collection Time: 03/27/24  4:09 AM  Result Value Ref Range   Prothrombin Time 15.8 (H) 11.4 - 15.2 seconds   INR 1.2 0.8 - 1.2    Comment: (NOTE) INR goal varies based on device and disease states. Performed at [facility], [address] [place], [place] [address]   Magnesium      Status: None   Collection Time: 03/27/24  4:09 AM  Result Value Ref Range   Magnesium  2.0 1.7 - 2.4 mg/dL    Comment: Performed at [facility], [address], [place], [place] [address]  CBC      Status: Abnormal   Collection Time: 03/27/24  4:09 AM  Result Value Ref Range   WBC 7.8 4.0 - 10.5 K/uL   RBC 3.49 (L) 3.87 - 5.11 MIL/uL   Hemoglobin 11.2 (L) 12.0 - 15.0 g/dL   HCT 64.3 (L) 32.9 - 51.8 %   MCV 93.4 80.0 - 100.0 fL   MCH 32.1 26.0 - 34.0 pg   MCHC 34.4 30.0 - 36.0 g/dL   RDW 84.1 (H) 66.0 - 63.0 %   Platelets 96 (L) 150 - 400 K/uL    Comment: SPECIMEN CHECKED FOR CLOTS Immature Platelet Fraction may be clinically indicated, consider ordering this additional test [id number] REPEATED TO VERIFY PLATELETS APPEAR DECREASED    nRBC 0.3 (H) 0.0 - 0.2 %    Comment: Performed at [facility], [address] [place], [place] [address]  Comprehensive metabolic panel with GFR     Status: Abnormal   Collection Time: 03/27/24  4:09 AM  Result Value Ref Range   Sodium 134 (L) 135 - 145 mmol/L   Potassium 3.5 3.5 - 5.1 mmol/L   Chloride 101 98 - 111 mmol/L   CO2 24 22 - 32 mmol/L   Glucose, Bld 104 (H) 70 - 99 mg/dL    Comment: Glucose reference range applies only to samples taken after fasting for at least 8 hours.   BUN <5 (L) 6 - 20 mg/dL   Creatinine, Ser 2.20 0.44 - 1.00 mg/dL    Comment: ICTERUS AT THIS LEVEL MAY AFFECT RESULT   Calcium 8.2 (L) 8.9 - 10.3 mg/dL   Total  Protein 6.3 (L) 6.5 - 8.1 g/dL   Albumin 2.7 (L) 3.5 - 5.0 g/dL   AST 161 (H) 15 - 41 U/L   ALT 92 (H) 0 - 44 U/L   Alkaline Phosphatase 134 (H) 38 - 126 U/L   Total Bilirubin 21.2 (HH) 0.0 - 1.2 mg/dL    Comment: CRITICAL VALUE NOTED. VALUE IS CONSISTENT WITH PREVIOUSLY REPORTED/CALLED VALUE   GFR, Estimated >60 >60 mL/min    Comment: (NOTE) Calculated using the CKD-EPI Creatinine Equation (2021)    Anion gap 9 5 - 15    Comment: Performed at [facility], [address], [place], [place] [address]    DG CHEST PORT 1 VIEW Result Date: 03/26/2024 CLINICAL DATA:  Fever EXAM: PORTABLE CHEST 1 VIEW COMPARISON:  Prior chest x-ray 10/18/2018 FINDINGS: Low inspiratory volumes. Faint linear  airspace opacities overlying the lingula likely reflect atelectasis. No focal airspace infiltrate, pleural effusion or pneumothorax. Cardiac and mediastinal contours are unchanged. IMPRESSION: Low inspiratory volumes with probable trace left basilar atelectasis. Otherwise, no acute cardiopulmonary process. Specifically, no focal airspace infiltrate to suggest pneumonia. Electronically Signed   By: Fernando Hoyer M.D.   On: 03/26/2024 12:49   MELD 3.0: 25 at 03/27/2024  4:09 AM MELD-Na: 22 at 03/27/2024  4:09 AM Calculated from: Serum Creatinine: 0.78 mg/dL (Using min of 1 mg/dL) at 5/40/9811  9:14 AM Serum Sodium: 134 mmol/L at 03/27/2024  4:09 AM Total Bilirubin: 21.2 mg/dL at 7/82/9562  1:30 AM Serum Albumin: 2.7 g/dL at 8/65/7846  9:62 AM INR(ratio): 1.2 at 03/27/2024  4:09 AM Age at listing (hypothetical): 32 years Sex: Female at 03/27/2024  4:09 AM  Assessment & Plan:  Stephanie Mercado is a 33 y.o. female who was admitted with withdrawal symptoms and hyperbilirubinemia.  She has been treated for alcoholic hepatitis during admission.  General surgery was consulted secondary to an MRCP on 5/10 demonstrating findings concerning for acute cholecystitis.  Imaging and blood work evaluated by myself.  -While the patient has MRCP that is demonstrating wall thickening with some pericholecystic fluid and stones, concerning for acute cholecystitis, clinically, the patient is not presenting as an acute cholecystitis.  She has no abdominal pain, is tolerating a diet without nausea and vomiting, and has no leukocytosis.  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  Her current MELD is 25.   -Given her significant liver disease, patient is not a candidate for cholecystectomy at [facility].  If there is concern that she has acute cholecystitis clinically, would recommend percutaneous cholecystostomy tube or transfer to a tertiary care facility for cholecystectomy.  Again, clinically the patient does not appear to have acute cholecystitis at this time, and there for I do not believe she needs to proceed with percutaneous cholecystostomy tube or transfer -No need for acute surgical intervention at this time -Okay for diet -Appreciate GI recommendations regarding liver disease -Patient stable for discharge from a surgical standpoint -Care per hospitalist  All questions were answered to the satisfaction of the patient.  Note: Portions of this report may have been transcribed using voice recognition software. Every effort has been made to ensure accuracy; however, inadvertent computerized transcription errors may still be present.   --  Lidia Reels, DO [facility] [address] [place], [place] [address] [phone] (office)

## 2024-03-27 NOTE — Plan of Care (Signed)

## 2024-03-27 NOTE — Progress Notes (Addendum)
 Subjective: Feeling good today. Ready to go home. Bowels are moving better. She states she knows she needs to stop drinking. Denies any GI symptoms. A/ox4.  Objective: Vital signs in last 24 hours: Temp:  [98 F (36.7 C)-99.4 F (37.4 C)] 99.2 F (37.3 C) (05/14 0824) Pulse Rate:  [88-110] 88 (05/14 0824) Resp:  [18-25] 18 (05/14 0347) BP: (129-143)/(78-107) 141/104 (05/14 0824) SpO2:  [95 %-98 %] 98 % (05/14 0824) Last BM Date : 03/26/24 General:   Alert and oriented, pleasant Head:  Normocephalic and atraumatic. Eyes:  sclera are icteric  Neck:  Supple, without thyromegaly or masses.  Heart:  S1, S2 present, no murmurs noted.  Lungs: Clear to auscultation bilaterally, without wheezing, rales, or rhonchi.  Abdomen:  Bowel sounds present, soft, non-tender, non-distended. No HSM or hernias noted. No rebound or guarding. No masses appreciated  Msk:  Symmetrical without gross deformities. Normal posture. Extremities:  Without clubbing or edema. Neurologic:  Alert and  oriented x4;  grossly normal neurologically. No asterixis  Skin:  Warm and dry, intact without significant lesions. Jaundiced  Psych:  Alert and cooperative. Normal mood and affect.  Intake/Output from previous day: 05/13 0701 - 05/14 0700 In: 1012.4 [P.O.:240; I.V.:772.4] Out: -  Intake/Output this shift: No intake/output data recorded.  Lab Results: Recent Labs    03/25/24 0549 03/26/24 0525 03/27/24 0409  WBC 4.9 5.9 7.8  HGB 10.4* 10.8* 11.2*  HCT 31.1* 30.6* 32.6*  PLT 55* 73* 96*   BMET Recent Labs    03/25/24 0549 03/26/24 0525  NA 139 137  K 3.1* 4.2  CL 100 100  CO2 28 32  GLUCOSE 116* 119*  BUN <5* <5*  CREATININE 0.48 0.78  CALCIUM 8.6* 8.0*   LFT Recent Labs    03/25/24 0549 03/26/24 0525  PROT 5.7* 5.7*  ALBUMIN 2.6* 2.5*  AST 170* 138*  ALT 121* 102*  ALKPHOS 160* 154*  BILITOT 20.6* 19.7*   PT/INR Recent Labs    03/26/24 0525 03/27/24 0409  LABPROT 16.0* 15.8*   INR 1.3* 1.2    Studies/Results: DG CHEST PORT 1 VIEW Result Date: 03/26/2024 CLINICAL DATA:  Fever EXAM: PORTABLE CHEST 1 VIEW COMPARISON:  Prior chest x-ray 10/18/2018 FINDINGS: Low inspiratory volumes. Faint linear airspace opacities overlying the lingula likely reflect atelectasis. No focal airspace infiltrate, pleural effusion or pneumothorax. Cardiac and mediastinal contours are unchanged. IMPRESSION: Low inspiratory volumes with probable trace left basilar atelectasis. Otherwise, no acute cardiopulmonary process. Specifically, no focal airspace infiltrate to suggest pneumonia. Electronically Signed   By: Fernando Hoyer M.D.   On: 03/26/2024 12:49   US  RENAL Result Date: 03/25/2024 CLINICAL DATA:  Right-sided ureteral obstruction and hydronephrosis EXAM: RENAL / URINARY TRACT ULTRASOUND COMPLETE COMPARISON:  MRI abdomen Mar 22, 2024 renal ultrasound Mar 22, 2024 CT renal Apr 11, 2023 FINDINGS: Right Kidney: Renal measurements: 11.5 x 4.5 x 5.1 cm = volume: 140 mL. Moderate right hydronephrosis with cortical thinning correlate with the prior imaging. Left Kidney: Renal measurements: 13.8 x 6.2 x 5.0 = volume: 224.8 mL. Echogenicity within normal limits. No mass or hydronephrosis visualized. Bladder: Prevoid 208 cc, postvoid 2.5 Other: Echogenic liver correlate with fatty liver changes IMPRESSION: *Moderate right hydronephrosis with cortical thinning correlate with the prior imaging. *Echogenic liver correlate with fatty liver changes. Electronically Signed   By: Fredrich Jefferson M.D.   On: 03/25/2024 13:52    Assessment: Stephanie Mercado is a 33 y.o. female with a history of chronic alcohol and  polysubstance abuse admitted with ETOH withdrawal, transaminitis, hyperbilirubinemia, alcoholic hepatitis   Transaminitis/hyperbilirubinemia/Alcoholic hepatitis: Transaminitis/hyperbilirubinemia likely secondary to alcoholic hepatitis.  LFTs slowly improving, T. bili fluctuating 19-20 range, suspect this will  lag behind improvement in transaminases.  MRCP without obstruction.   -Thickening of gallbladder wall likely reactive effect and does not clinically fit acute cholecystitis as she has had no abdominal pain.   -Acute viral markers negative.  - AMA, ANA, ASMA negative.   -Remains without encephalopathy though her ammonia has been elevated.   -DF 34.1  (33.5, 33) MELD 3.0: 25  it has been recommended to hold off on starting prednisolone unless worsening, will continue to monitor.  Hyper ammoniemia: Without signs of encephalopathy, no asterixis.  Likely related to acute illness with chronic liver disease.  She is on lactulose currently which is also helping with constipation.  Can hold off on rechecking ammonia at this point.   Plan: Continue lactulose 20 g twice daily Monitor for signs of HE Continue to follow LFTs, INR Continue supportive measures Complete alcohol cessation Close outpatient GI follow-up 2-3 weeks after discharge, repeat CMP, INR, CBC 2 weeks  Holding prednisolone at this time unless worsening clinically   LOS: 5 days    03/27/2024, 8:41 AM   Chelsea L. Carlan, MSN, APRN, AGNP-C Adult-Gerontology Nurse Practitioner [facility] at [facility]

## 2024-03-27 NOTE — Discharge Summary (Signed)
 Physician Discharge Summary   Patient: Stephanie Mercado MRN: [id number] DOB: [birth year]  Admit date:     03/22/2024  Discharge date: 03/27/24  Discharge Physician: Myrtie Atkinson Hillel Card   PCP: [name], FNP   Recommendations at discharge:   Please follow up with primary care provider within 1-2 weeks  Please repeat BMP, LFTs and CBC in one week  Please follow up on/with [facility] in 2 weeks    Hospital Course: 34 year old female alcoholic last drink about 3 days ago, polysubstance abuse, benzodiazepine abuse and addiction, ADHD on Adderall, history of opioid overdose, alcoholic hepatitis, hyperbilirubinemia with elevated LFTs, history of cocaine abuse, has had increasing confusion at home over the past few hours prior to arrival also notably having more jaundiced in the eyes and skin.  She had admitted to using cocaine Suboxone and Adderall recently.  Pt reportedly drinks Four Loko daily.  It is a flavored malt beverage known for high alcohol content, ranging from 8% to 14% alcohol by volume.  Her workup in the ED was positive for acute hepatic dysfunction with elevated LFTs and hyperbilirubinemia and severe thrombocytopenia with a platelet count of 36, bilirubin was 25.  Her hemoglobin was 13.  WBC 3.3.  Her acute hepatitis panel testing was negative.  INR was reassuring at 1.2.  Pt was admitted to ICU for acute alcohol withdrawal and subsequently developed delirium tremens.  She continued to gave agitation on Precedex  and required phenobarbital.  Over the next 2-3 days, her mental returned to baseline and her phenobarb was weaned. Her diet was advanced which she tolerated.  She had no abd pain, n/v and no signs of encephalopathy.  GI followed patient and will arrange for outpt follow up.  Assessment and Plan: Acute Alcohol Withdrawal  Delirium Tremens - Improving  - Patient presented early with very severe alcohol withdrawal symptoms severely agitated and after I started a Precedex  infusion to  max dose she had no response to this in addition to doses of IV midazolam  and IV haloperidol .  Dr. Lincoln Renshaw reached out to critical care team at [facility] and spoke with Dr. Katrine Parody who recommended phenobarbital protocol.  I called the Pharm.D. covering our ICU and requested to initiate the phenobarbital alcohol withdrawal protocol.  Continue CIWA.  Continue Haldol  as needed.  Continue neurochecks.  Continue IV fluid hydration and other supportive measures.  - Pt seems to be responding favorably to the phenobarbital alcohol withdrawal protocol, pt has been less severely agitated, less combative and has been able to eat and drink and take some oral medications;  - Pt declines residential alcohol abuse treatment and says she will go home but agreeable to outpatient alcohol abuse treatment.  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  - obtain renal US --mod right hydronephrosis - 04/11/23 CT renal---severe right hydronephrosis - discussed with Dr. Claretta Croft (urologist) he recommends referral to outpatient urology at discharge    Alcoholic Hepatitis Hyperbilirubinemia Transaminitis Hyperammonemia - appreciate GI consultation, did not feel steroids needed at this time - LFTs trending down - continue supportive measures  - ammonia up  today at 118,  lactulose started by GI team - 5/14--no signs of encephalopathy - discussed with GI team, MRI findings suggesting acute cholecystitis not felt to be clinically significant as she has no clinical symptoms of this and this is likely reactive changes from adjacent  inflammation from liver disease -pt tolerating diet without abd pain, n/v -discussed with gen surgery, Dr. Cherilyn Corn agrees that pt does no clinically have cholecystitis --DF 34.1  (33.5, 33) MELD 3.0: 25 at time of d/c - AMA, ANA, ASMA negative.    Severe thrombocytopenia  - platelets improved to 73>>96 - secondary to liver disease  - avoiding heparins - no active bleeding found   Fever - resolved - no fever >24 hours - check urinalysis --no pyuria - pt denies dysuria symptoms - CXR--no infiltrates      Consultants: GI, general surgery Procedures performed: none  Disposition: Home Diet recommendation:  Cardiac diet DISCHARGE MEDICATION: Allergies as of 03/27/2024       Reactions   Penicillins Hives   Immediate rash, facial/tongue/throat swelling, SOB or lightheadedness with hypotension   Rocephin  [ceftriaxone ] Other (See Comments)   Muscle cramps in back (lasted about 8 hrs)        Medication List     STOP taking these medications    Adderall 20 MG tablet Generic drug: amphetamine -dextroamphetamine       TAKE these medications    acetaminophen  500 MG tablet Commonly known as: TYLENOL  Take 1,000 mg by mouth every 6 (six) hours as needed for mild pain (pain score 1-3).   ARIPiprazole  5 MG tablet Commonly known as: ABILIFY  Take 1 tablet (5 mg total) by mouth daily.   doxepin  75 MG capsule Commonly known as: SINEQUAN  Take 1 capsule (75 mg total) by mouth at bedtime.   gabapentin 300 MG capsule Commonly known as: NEURONTIN Take 300 mg by mouth 3 (three) times daily.   lactulose 10 GM/15ML solution Commonly known as: CHRONULAC Take 30 mLs (20 g total) by mouth 2 (two) times daily.        Discharge Exam: Filed Weights   03/22/24 1317 03/22/24 2008  Weight: 90.7 kg 92.3 kg   HEENT:  [place]/AT, No thrush, no icterus CV:  RRR, no rub, no S3, no S4 Lung:  CTA, no wheeze, no rhonchi Abd:  soft/+BS, NT Ext:  No edema, no lymphangitis, no synovitis,  no rash   Condition at discharge: stable  The results of significant diagnostics from this hospitalization (including imaging, microbiology, ancillary and laboratory) are listed below for reference.   Imaging Studies: DG CHEST PORT 1 VIEW Result Date: 03/26/2024 CLINICAL DATA:  Fever EXAM: PORTABLE CHEST 1 VIEW COMPARISON:  Prior chest x-ray 10/18/2018 FINDINGS: Low inspiratory volumes. Faint linear airspace opacities overlying the lingula likely reflect atelectasis. No focal airspace infiltrate, pleural effusion or pneumothorax. Cardiac and mediastinal contours are unchanged. IMPRESSION: Low inspiratory volumes with probable trace left basilar atelectasis. Otherwise, no acute cardiopulmonary process. Specifically, no focal airspace infiltrate to suggest pneumonia. Electronically Signed   By: Fernando Hoyer M.D.   On: 03/26/2024 12:49   US  RENAL Result Date: 03/25/2024 CLINICAL DATA:  Right-sided ureteral obstruction and hydronephrosis EXAM: RENAL / URINARY TRACT ULTRASOUND COMPLETE COMPARISON:  MRI abdomen Mar 22, 2024 renal ultrasound Mar 22, 2024 CT renal Apr 11, 2023 FINDINGS: Right Kidney: Renal measurements: 11.5 x 4.5 x 5.1 cm = volume: 140 mL. Moderate right hydronephrosis with cortical thinning correlate with the prior imaging. Left Kidney: Renal measurements: 13.8 x 6.2 x  5.0 = volume: 224.8 mL. Echogenicity within normal limits. No mass or hydronephrosis visualized. Bladder: Prevoid 208 cc, postvoid 2.5 Other: Echogenic liver correlate with fatty liver changes IMPRESSION: *Moderate right hydronephrosis with cortical thinning correlate with the prior imaging. *Echogenic liver correlate with fatty liver changes. Electronically Signed   By: Fredrich Jefferson M.D.   On: 03/25/2024 13:52   MR ABDOMEN MRCP W WO CONTAST Result Date: 03/23/2024 CLINICAL DATA:  Jaundiced. EXAM: MRI ABDOMEN WITHOUT AND WITH CONTRAST (INCLUDING MRCP) TECHNIQUE: Multiplanar multisequence MR imaging of the abdomen was  performed both before and after the administration of intravenous contrast. Heavily T2-weighted images of the biliary and pancreatic ducts were obtained, and three-dimensional MRCP images were rendered by post processing. CONTRAST:  9mL GADAVIST GADOBUTROL 1 MMOL/ML IV SOLN, 9mL GADAVIST GADOBUTROL 1 MMOL/ML IV SOLN COMPARISON:  Prior CT scan from 04/11/2023 and right upper quadrant ultrasound examination 03/22/2024 FINDINGS: Lower chest: The lung bases are grossly clear. No pulmonary lesions or pleural effusions. No pericardial effusion. Hepatobiliary: Moderate hepatomegaly. There is severe diffuse fatty infiltration of the liver but no worrisome hepatic lesions. No intrahepatic biliary dilatation. Normal caliber and course of the common bile duct. No common bile duct stones are identified. The gallbladder is abnormal. It demonstrates mild distension and there are numerous small dependently layering gallstones along with gallbladder sludge. There is also thickening of the gallbladder wall, pericholecystic fluid and contrast enhancement of the gallbladder wall. Findings suggest acute cholecystitis. Pancreas: No mass, inflammatory changes, or other parenchymal abnormality identified. Spleen:  Borderline splenic enlargement.  No splenic lesions. Adrenals/Urinary Tract:  The adrenal glands are normal. 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 The left kidney is unremarkable. Stomach/Bowel: Visualized portions within the abdomen are unremarkable. Vascular/Lymphatic: The aorta and branch vessels are normal. The major venous structures are patent. No mesenteric or retroperitoneal lymphadenopathy. Other: No ascites. There is a small amount of pericholecystic fluid. No subcutaneous lesions. Musculoskeletal: Thoracolumbar scoliosis.  No bone lesions. IMPRESSION: 1. MR findings consistent with acute cholecystitis. 2. Normal caliber and course of the common bile  duct. No common bile duct stones are identified. 3. Moderate hepatomegaly and severe diffuse fatty infiltration of the liver. 4. 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 Electronically Signed   By: Marrian Siva M.D.   On: 03/23/2024 21:38   MR 3D Recon At Scanner Result Date: 03/23/2024 CLINICAL DATA:  Jaundiced. EXAM: MRI ABDOMEN WITHOUT AND WITH CONTRAST (INCLUDING MRCP) TECHNIQUE: Multiplanar multisequence MR imaging of the abdomen was performed both before and after the administration of intravenous contrast. Heavily T2-weighted images of the biliary and pancreatic ducts were obtained, and three-dimensional MRCP images were rendered by post processing. CONTRAST:  9mL GADAVIST GADOBUTROL 1 MMOL/ML IV SOLN, 9mL GADAVIST GADOBUTROL 1 MMOL/ML IV SOLN COMPARISON:  Prior CT scan from 04/11/2023 and right upper quadrant ultrasound examination 03/22/2024 FINDINGS: Lower chest: The lung bases are grossly clear. No pulmonary lesions or pleural effusions. No pericardial effusion. Hepatobiliary: Moderate hepatomegaly. There is severe diffuse fatty infiltration of the liver but no worrisome hepatic lesions. No intrahepatic biliary dilatation. Normal caliber and course of the common bile duct. No common bile duct stones are identified. The gallbladder is abnormal. It demonstrates mild distension and there are numerous small dependently layering gallstones along with gallbladder sludge. There is also thickening of the gallbladder wall, pericholecystic fluid and contrast enhancement of the gallbladder wall. Findings suggest acute cholecystitis. Pancreas: No mass, inflammatory changes,  or other parenchymal abnormality identified. Spleen:  Borderline splenic enlargement.  No splenic lesions. Adrenals/Urinary Tract:  The adrenal glands are normal. 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 The  left kidney is unremarkable. Stomach/Bowel: Visualized portions within the abdomen are unremarkable. Vascular/Lymphatic: The aorta and branch vessels are normal. The major venous structures are patent. No mesenteric or retroperitoneal lymphadenopathy. Other: No ascites. There is a small amount of pericholecystic fluid. No subcutaneous lesions. Musculoskeletal: Thoracolumbar scoliosis.  No bone lesions. IMPRESSION: 1. MR findings consistent with acute cholecystitis. 2. Normal caliber and course of the common bile duct. No common bile duct stones are identified. 3. Moderate hepatomegaly and severe diffuse fatty infiltration of the liver. 4. 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 Electronically Signed   By: Marrian Siva M.D.   On: 03/23/2024 21:38   US  Abdomen Limited RUQ (LIVER/GB) Result Date: 03/22/2024 CLINICAL DATA:  Right upper quadrant pain EXAM: ULTRASOUND ABDOMEN LIMITED RIGHT UPPER QUADRANT COMPARISON:  Renal stone CT 04/03/2023 FINDINGS: Gallbladder: Set gallbladder with sludge. Few stones as well. No wall thickening or adjacent fluid. No reported sonographic Murphy's sign Common bile duct: Diameter: 6 mm Liver: Diffusely echogenic hepatic parenchyma consistent with fatty liver infiltration. With this level of echogenicity evaluation for underlying mass lesion is limited. Portal vein is patent on color Doppler imaging with normal direction of blood flow towards the liver. Other: None. IMPRESSION: Distended gallbladder with sludge and stones. No wall thickening or reported Murphy's sign. If there is further concern of acute cholecystitis a HIDA scan may be useful. The duct at 6 mm, upper limits of normal. This also could be evaluated further when clinically appropriate. Fatty liver infiltration. Known right renal collecting system dilatation. Please correlate with prior CT Electronically Signed   By: Adrianna Horde M.D.   On: 03/22/2024  15:05    Microbiology: Results for orders placed or performed during the hospital encounter of 03/22/24  MRSA Next Gen by PCR, Nasal     Status: None   Collection Time: 03/22/24  8:01 PM   Specimen: Nasal Mucosa; Nasal Swab  Result Value Ref Range Status   MRSA by PCR Next Gen NOT DETECTED NOT DETECTED Final    Comment: (NOTE) The GeneXpert MRSA Assay (FDA approved for NASAL specimens only), is one component of a comprehensive MRSA colonization surveillance program. It is not intended to diagnose MRSA infection nor to guide or monitor treatment for MRSA infections. Test performance is not FDA approved in patients less than 31 years old. Performed at [facility], [address] [place], [place] [address]     Labs: CBC: Recent Labs  Lab 03/22/24 1338 03/23/24 0512 03/24/24 0556 03/25/24 0549 03/26/24 0525 03/27/24 0409  WBC 3.3* 3.8* 4.1 4.9 5.9 7.8  NEUTROABS 2.2  --   --   --   --   --   HGB 13.0 11.1* 10.2* 10.4* 10.8* 11.2*  HCT 36.3 31.3* 29.5* 31.1* 30.6* 32.6*  MCV 93.1 92.1 96.1 95.1 93.6 93.4  PLT 36* 35* 45* 55* 73* 96*   Basic Metabolic Panel: Recent Labs  Lab 03/22/24 1338 03/23/24 0512 03/23/24 0853 03/24/24 0556 03/25/24 0549 03/26/24 0525 03/27/24 0409  NA 131* 135  --  137 139 137 134*  K 3.3* 3.5  --  3.6 3.1* 4.2 3.5  CL 92* 100  --  107 100 100 101  CO2 23 24  --  21* 28 32 24  GLUCOSE 219* 129*  --  135* 116* 119* 104*  BUN 6 5*  --  5* <5* <5* <5*  CREATININE 0.53 0.49  --  0.63 0.48 0.78 0.78  CALCIUM 8.8* 8.4*  --  8.0* 8.6* 8.0* 8.2*  MG 1.4*  --  1.5* 1.9 1.6* 2.0 2.0  PHOS 2.4*  --   --   --   --   --   --    Liver Function Tests: Recent Labs  Lab 03/23/24 0512 03/24/24 0556 03/25/24 0549 03/26/24 0525 03/27/24 0409  AST 295* 207* 170* 138* 127*  ALT 171* 136* 121* 102* 92*  ALKPHOS 222* 175* 160* 154* 134*  BILITOT 22.3* 19.2* 20.6* 19.7* 21.2*  PROT 6.1* 5.1* 5.7* 5.7* 6.3*  ALBUMIN 3.0* 2.5* 2.6* 2.5* 2.7*   CBG: No  results for input(s): "GLUCAP" in the last 168 hours.  Discharge time spent: greater than 30 minutes.  Signed: Demaris Fillers, MD [facility] 03/27/2024

## 2024-03-28 ENCOUNTER — Other Ambulatory Visit: Payer: Self-pay | Admitting: *Deleted

## 2024-03-28 ENCOUNTER — Encounter (HOSPITAL_BASED_OUTPATIENT_CLINIC_OR_DEPARTMENT_OTHER): Payer: Self-pay

## 2024-03-28 ENCOUNTER — Encounter: Payer: Self-pay | Admitting: *Deleted

## 2024-03-28 DIAGNOSIS — R7989 Other specified abnormal findings of blood chemistry: Secondary | ICD-10-CM

## 2024-03-28 DIAGNOSIS — D509 Iron deficiency anemia, unspecified: Secondary | ICD-10-CM

## 2024-03-28 NOTE — Telephone Encounter (Signed)
LMOM for pt to call office. Also sent pt a MyChart message.

## 2024-04-02 ENCOUNTER — Encounter: Payer: Self-pay | Admitting: *Deleted

## 2024-04-02 ENCOUNTER — Other Ambulatory Visit: Payer: Self-pay | Admitting: *Deleted

## 2024-04-02 DIAGNOSIS — R7989 Other specified abnormal findings of blood chemistry: Secondary | ICD-10-CM

## 2024-04-02 DIAGNOSIS — D509 Iron deficiency anemia, unspecified: Secondary | ICD-10-CM

## 2024-04-02 NOTE — Telephone Encounter (Signed)
 Tried several time to reach pt. Voice mail is full. Mailed a letter.

## 2024-04-15 NOTE — Progress Notes (Deleted)
 GI Office Note    Referring Provider: [name], * Primary Care Physician:  [name], FNP Primary Gastroenterologist: Windsor Hatcher.Rourk, MD  Date:  04/15/2024  ID:  Stephanie Mercado, DOB September 28, 1991, MRN [id number]   Chief Complaint   No chief complaint on file.   History of Present Illness  Stephanie Mercado is a 33 y.o. female with a history of migraines, polysubstance abuse alcohol abuse, and ADHD presenting today for hospital follow-up of jaundice/transaminitis.  Seen during recent hospitalization for transaminitis, alcoholic hepatitis. Present to the ED with jaundice.  During her hospitalization she did develop some tremors as well as agitation.  She was on CIWA protocol.  She was given a dose of vitamin K given elevation in INR during her hospitalization.  Deemed to not be a candidate for steroids.  She does had some elevation in ammonia levels during hospitalization but although she had some agitation there was no obvious encephalopathy.  No asterixis.  Given some constipation and the elevation in ammonia she was started on lactulose .  She reportedly was planning to go to AA on discharge and stated she was already done drinking alcohol.  MELD was as high as 25 during hospitalization.     Latest Ref Rng & Units 03/27/2024    4:09 AM 03/26/2024    5:25 AM 03/25/2024    5:49 AM  CMP  Glucose 70 - 99 mg/dL 782  956  213   BUN 6 - 20 mg/dL <5  <5  <5   Creatinine 0.44 - 1.00 mg/dL 0.86  5.78  4.69   Sodium 135 - 145 mmol/L 134  137  139   Potassium 3.5 - 5.1 mmol/L 3.5  4.2  3.1   Chloride 98 - 111 mmol/L 101  100  100   CO2 22 - 32 mmol/L 24  32  28   Calcium 8.9 - 10.3 mg/dL 8.2  8.0  8.6   Total Protein 6.5 - 8.1 g/dL 6.3  5.7  5.7   Total Bilirubin 0.0 - 1.2 mg/dL 62.9  52.8  41.3   Alkaline Phos 38 - 126 U/L 134  154  160   AST 15 - 41 U/L 127  138  170   ALT 0 - 44 U/L 92  102  121    Workup: Acute viral markers negative. AMA, ANA, ASMA negative.  MRCP without  obstruction.  Gallbladder with layering gallstones and sludge, gallbladder wall thickening concerning for cholecystitis suspected to be reactive.  There was moderate hepatomegaly and severe fatty infiltration RUQ US   -CBD 6 mm, diffuse echogenic hepatic parenchyma consistent with fatty liver infiltration.  Portal vein patent on color Doppler.  Distended gallbladder with sludge and stones without wall thickening.   Today:  HE: Ascites: Jaundice: Pruritus:  Alcohol intake: Tobacco use:  Bowel habits:   Wt Readings from Last 3 Encounters:  03/22/24 203 lb 7.8 oz (92.3 kg)  12/22/23 206 lb (93.4 kg)  04/11/23 190 lb (86.2 kg)    Current Outpatient Medications  Medication Sig Dispense Refill   acetaminophen  (TYLENOL ) 500 MG tablet Take 1,000 mg by mouth every 6 (six) hours as needed for mild pain (pain score 1-3).     ARIPiprazole  (ABILIFY ) 5 MG tablet Take 1 tablet (5 mg total) by mouth daily. 30 tablet 5   doxepin  (SINEQUAN ) 75 MG capsule Take 1 capsule (75 mg total) by mouth at bedtime. 30 capsule 5   gabapentin (NEURONTIN) 300 MG capsule Take  300 mg by mouth 3 (three) times daily.     lactulose  (CHRONULAC ) 10 GM/15ML solution Take 30 mLs (20 g total) by mouth 2 (two) times daily. 945 mL 1   No current facility-administered medications for this visit.    Past Medical History:  Diagnosis Date   ADHD    Alcohol withdrawal delirium, acute, hyperactive (HCC) 02/05/2018   Allergy    Complicated UTI (urinary tract infection) 04/12/2023   Delirium 02/05/2018   Eczema    Intentional overdose of drug in tablet form (HCC)    Migraines    Overdose 02/05/2018   Sepsis (HCC) 04/12/2023    Past Surgical History:  Procedure Laterality Date   TONSILLECTOMY     WISDOM TOOTH EXTRACTION      Family History  Problem Relation Age of Onset   Cancer Mother    Kidney disease Mother    Hypertension Father    Diabetes Paternal Grandfather     Allergies as of 04/16/2024 - Review  Complete 03/22/2024  Allergen Reaction Noted   Penicillins Hives 02/15/2013   Rocephin  [ceftriaxone ] Other (See Comments) 06/22/2016    Social History   Socioeconomic History   Marital status: Single    Spouse name: Not on file   Number of children: Not on file   Years of education: Not on file   Highest education level: Not on file  Occupational History   Not on file  Tobacco Use   Smoking status: Never   Smokeless tobacco: Never  Vaping Use   Vaping status: Never Used  Substance and Sexual Activity   Alcohol use: Yes    Comment: occas   Drug use: Not Currently    Types: Marijuana    Comment: a few days ago   Sexual activity: Yes    Birth control/protection: None  Other Topics Concern   Not on file  Social History Narrative   Not on file   Social Drivers of Health   Financial Resource Strain: Low Risk  (12/22/2023)   Overall Financial Resource Strain (CARDIA)    Difficulty of Paying Living Expenses: Not hard at all  Food Insecurity: No Food Insecurity (03/22/2024)   Hunger Vital Sign    Worried About Running Out of Food in the Last Year: Never true    Ran Out of Food in the Last Year: Never true  Transportation Needs: No Transportation Needs (03/22/2024)   PRAPARE - Administrator, Civil Service (Medical): No    Lack of Transportation (Non-Medical): No  Physical Activity: Insufficiently Active (12/22/2023)   Exercise Vital Sign    Days of Exercise per Week: 2 days    Minutes of Exercise per Session: 30 min  Stress: No Stress Concern Present (12/22/2023)   [employer] of Occupational Health - Occupational Stress Questionnaire    Feeling of Stress : Not at all  Social Connections: Moderately Isolated (12/22/2023)   Social Connection and Isolation Panel [NHANES]    Frequency of Communication with Friends and Family: More than three times a week    Frequency of Social Gatherings with Friends and Family: Once a week    Attends Religious Services: Never     Database administrator or Organizations: No    Attends Banker Meetings: Never    Marital Status: Living with partner     Review of Systems   Gen: Denies fever, chills, anorexia. Denies fatigue, weakness, weight loss.  CV: Denies chest pain, palpitations, syncope, peripheral edema, and claudication.  Resp: Denies dyspnea at rest, cough, wheezing, coughing up blood, and pleurisy. GI: See HPI Derm: Denies rash, itching, dry skin Psych: Denies depression, anxiety, memory loss, confusion. No homicidal or suicidal ideation.  Heme: Denies bruising, bleeding, and enlarged lymph nodes.  Physical Exam   LMP 02/24/2024   General:   Alert and oriented. No distress noted. Pleasant and cooperative.  Head:  Normocephalic and atraumatic. Eyes:  Conjuctiva clear without scleral icterus. Mouth:  Oral mucosa pink and moist. Good dentition. No lesions. Lungs:  Clear to auscultation bilaterally. No wheezes, rales, or rhonchi. No distress.  Heart:  S1, S2 present without murmurs appreciated.  Abdomen:  +BS, soft, non-tender and non-distended. No rebound or guarding. No HSM or masses noted. Rectal: *** Msk:  Symmetrical without gross deformities. Normal posture. Extremities:  Without edema. Neurologic:  Alert and  oriented x4 Psych:  Alert and cooperative. Normal mood and affect.  Assessment  CHASLYN EISEN is a 33 y.o. female with a history of migraines, polysubstance abuse alcohol abuse, and ADHD presenting today with ***  Alcoholic Otitis, transaminitis, hyperbilirubinemia, hyperammonemia, hepatic steatosis: -  Constipation: -  PLAN   *** Continue lactulose  CBC, CMP, INR, AFP, ELF, FibroSure Complete alcohol and drug cessation Continue AA*** High-protein/low fat diet diet Weight loss Repeat RUQ US  with elastography in 6 months     Julian Obey, MSN, FNP-BC, AGACNP-BC [facility]

## 2024-04-16 ENCOUNTER — Ambulatory Visit: Admitting: Gastroenterology

## 2024-04-16 ENCOUNTER — Encounter: Payer: Self-pay | Admitting: Gastroenterology

## 2024-07-31 ENCOUNTER — Ambulatory Visit: Admitting: Surgical

## 2024-08-07 ENCOUNTER — Encounter: Payer: Self-pay | Admitting: Surgical

## 2024-08-07 ENCOUNTER — Other Ambulatory Visit: Payer: Self-pay

## 2024-08-07 ENCOUNTER — Ambulatory Visit: Admitting: Surgical

## 2024-08-07 DIAGNOSIS — S62640A Nondisplaced fracture of proximal phalanx of right index finger, initial encounter for closed fracture: Secondary | ICD-10-CM | POA: Diagnosis not present

## 2024-08-07 DIAGNOSIS — M79641 Pain in right hand: Secondary | ICD-10-CM

## 2024-08-07 MED ORDER — HYDROCODONE-ACETAMINOPHEN 5-325 MG PO TABS: 1.0000 | ORAL_TABLET | Freq: Every evening | ORAL | 0 refills | Status: DC | PRN

## 2024-08-07 NOTE — Progress Notes (Signed)
 Office Visit Note   Patient: Stephanie Mercado           Date of Birth: 15-Oct-1991           MRN: [id number] Visit Date: 08/07/2024 Requested by: Knute Thersia Bitters, FNP [address] [place],  [place] [address] PCP: Knute Thersia Bitters, FNP  Subjective: Chief Complaint  Patient presents with   right hand pain    HPI: Stephanie Mercado is a 33 y.o. female who presents to the office reporting right finger injury.  Patient fell onto her hand 3-1/2 weeks ago after a chair leg gave out on her.  She felt immediate pain and swelling with ecchymosis that showed up several days later.  After bruising showed up, she went to urgent care and was told that it was fractured and she should see orthopedics.  She is here today reporting that she is in fairly constant pain at the base of the index finger.  Has some tingling in the tip of the finger but no numbness.  No history of prior injury to the right hand.  She is left-hand dominant.  She has decreased mobility of her index finger and to a lesser extent her long finger.  Has difficulty sleeping due to pain.  Taking Tylenol  and Advil  without much relief.  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  She enjoys walking and walking her dogs/cat.  She does vape but she has been holding off on this recently.                ROS: All systems reviewed are negative as they relate to the chief complaint within the history of present illness.  Patient denies fevers or chills.  Assessment & Plan: Visit Diagnoses:  1. Closed nondisplaced fracture of proximal phalanx of right index finger, initial encounter   2. Pain in right hand     Plan: Impression is 33 year old female who presents for evaluation of angulated right proximal phalanx fracture at the base of the phalanx.  It appears to extend into the MCP joint but does not appear to have rotational deformity.  It does have some early callus formation present on  radiographs.  Plan for CT scan of the right index finger today to better evaluate fracture pattern and then refer to Dr. Erwin for his opinion on best management.  One-time prescription for hydrocodone  prescribed to help with sleeping.  Follow-Up Instructions: No follow-ups on file.   Orders:  Orders Placed This Encounter  Procedures   DG Hand Complete Right   No orders of the defined types were placed in this encounter.     Procedures: No procedures performed   Clinical Data: No additional findings.  Objective: Vital Signs: LMP 07/24/2024 (Approximate)   Physical Exam:  Constitutional: Patient appears well-developed HEENT:  Head: Normocephalic Eyes:EOM are normal Neck: Normal range of motion Cardiovascular: Normal rate Pulmonary/chest: Effort normal Neurologic: Patient is alert Skin: Skin is warm Psychiatric: Patient has normal mood and affect  Ortho Exam: Ortho exam demonstrates right hand with palpable radial pulse.  Intact EPL, FPL, finger abduction.  She is able to make full fist with all of her fingers except for the right index finger which can flex somewhat at the DIP joint but significantly limited flexion at the PIP and MCP joint.  She has moderate tenderness at the MCP joint and pain reproduced with motion at this area.  With some forceful flexion  of this finger, there is no significant rotational deformity noted.  No ecchymosis or cellulitis noted.  Extension and flexion intact to other fingers.  Specialty Comments:  No specialty comments available.  Imaging: No results found.   PMFS History: Patient Active Problem List   Diagnosis Date Noted   Delirium tremens (HCC) 03/26/2024   Fever, unspecified 03/26/2024   Liver dysfunction 03/26/2024   Alcohol abuse 03/26/2024   Chronic Right UPJ (ureteropelvic junction) obstruction 03/25/2024   Alcoholic hepatitis 03/22/2024   Thrombocytopenia 03/22/2024   Affective psychosis 12/22/2023   Eating disorder  12/22/2023   Elevated BP without diagnosis of hypertension 12/22/2023   Family hx-breast malignancy 12/22/2023   Benign neoplasm breast skin 12/22/2023   History of renal stone 12/22/2023   Alcohol use disorder 04/12/2023   Chronic post-traumatic stress disorder (PTSD) 01/31/2022   Cocaine abuse, episodic use (HCC) 01/31/2022   Transaminitis 01/31/2022   Long term prescription benzodiazepine use 01/31/2022   Elevated liver enzymes 09/12/2018   Major depressive disorder 02/08/2018   Moderate alcohol use disorder (HCC)    Insomnia 10/12/2015   Skin lesion of back 07/25/2014   ADD (attention deficit disorder) 03/31/2014   Allergic rhinitis 03/31/2014   Migraine headache with aura 02/15/2013   Past Medical History:  Diagnosis Date   ADHD    Alcohol withdrawal delirium, acute, hyperactive (HCC) 02/05/2018   Allergy    Complicated UTI (urinary tract infection) 04/12/2023   Delirium 02/05/2018   Eczema    Intentional overdose of drug in tablet form (HCC)    Migraines    Overdose 02/05/2018   Sepsis (HCC) 04/12/2023    Family History  Problem Relation Age of Onset   Cancer Mother    Kidney disease Mother    Hypertension Father    Diabetes Paternal Grandfather     Past Surgical History:  Procedure Laterality Date   TONSILLECTOMY     WISDOM TOOTH EXTRACTION     Social History   Occupational History   Not on file  Tobacco Use   Smoking status: Never   Smokeless tobacco: Never  Vaping Use   Vaping status: Never Used  Substance and Sexual Activity   Alcohol use: Yes    Comment: occas   Drug use: Not Currently    Types: Marijuana    Comment: a few days ago   Sexual activity: Yes    Birth control/protection: None

## 2024-08-07 NOTE — Addendum Note (Signed)
 Addended by: TRINDA DEANE HERO on: 08/07/2024 01:13 PM   Modules accepted: Orders

## 2024-08-08 ENCOUNTER — Ambulatory Visit (HOSPITAL_COMMUNITY)

## 2024-08-09 ENCOUNTER — Ambulatory Visit (HOSPITAL_COMMUNITY)

## 2024-08-10 ENCOUNTER — Ambulatory Visit (HOSPITAL_COMMUNITY)
Admission: RE | Admit: 2024-08-10 | Discharge: 2024-08-10 | Disposition: A | Source: Ambulatory Visit | Attending: Surgical

## 2024-08-10 DIAGNOSIS — S62640A Nondisplaced fracture of proximal phalanx of right index finger, initial encounter for closed fracture: Secondary | ICD-10-CM | POA: Diagnosis present

## 2024-08-12 ENCOUNTER — Other Ambulatory Visit: Payer: Self-pay | Admitting: Orthopedic Surgery

## 2024-08-12 ENCOUNTER — Ambulatory Visit: Admitting: Orthopedic Surgery

## 2024-08-12 DIAGNOSIS — S62611A Displaced fracture of proximal phalanx of left index finger, initial encounter for closed fracture: Secondary | ICD-10-CM | POA: Diagnosis not present

## 2024-08-12 DIAGNOSIS — S62640A Nondisplaced fracture of proximal phalanx of right index finger, initial encounter for closed fracture: Secondary | ICD-10-CM

## 2024-08-12 MED ORDER — HYDROCODONE-ACETAMINOPHEN 5-325 MG PO TABS: 1.0000 | ORAL_TABLET | Freq: Four times a day (QID) | ORAL | 0 refills | Status: AC | PRN

## 2024-08-12 NOTE — Progress Notes (Signed)
 Stephanie Mercado - 33 y.o. female MRN [id number]  Date of birth: December 30, 1990  Office Visit Note: Visit Date: 08/12/2024 PCP: Knute Thersia Bitters, FNP Referred by: Knute Thersia Bitters, *  Subjective: No chief complaint on file.  HPI: Stephanie Mercado is a 33 y.o. female who presents today for right index finger injury sustained approximately 1 month prior.  Injury mechanism described as a mechanical injury to the right index finger with notable axial load.  She experienced significant pain and swelling at the index finger at the time of injury.  She was seen initially in the urgent care setting, underwent bedside splinting, however compliance was an issue.  She was seen recently by Herlene Calix, PA who referred her to me for hand surgical evaluation of significant comminuted fracture at the base of the proximal phalanx of the right index finger.  Pertinent ROS were reviewed with the patient and found to be negative unless otherwise specified above in HPI.   Visit Reason: right hand proximal phalanx base fracture, intra-articular Duration of symptoms: 1 month Hand dominance: left Occupation: Unemployed Diabetic: No Smoking: Yes Heart/Lung History: none Blood Thinners: none  Prior Testing/EMG: CT 08/10/24 Injections (Date): none Treatments: splint Prior Surgery: none    Assessment & Plan: Visit Diagnoses:  1. Closed displaced fracture of proximal phalanx of left index finger, initial encounter   2. Closed nondisplaced fracture of proximal phalanx of right index finger, initial encounter     Plan: Extensive discussion was had with the patient today regarding her right hand index finger proximal phalanx base fracture with comminution and intra-articular involvement.  We discussed the underlying significance of this injury, the intra-articular involvement and the chronicity of this fracture.  Based upon her workup from an x-ray and CT standpoint, there is some ongoing interval healing, however  the joint remains dislocated in conjunction with her comminuted fracture at the MP joint.  We discussed conservative versus surgical treatment modalities.  From a conservative standpoint, we discussed ongoing observation, however given her significant pain in this region, this consistent with the underlying fracture dislocation at the index finger MCP joint and notable comminution of the proximal phalanx base.  I explained that the ongoing healing will remain in the position it is currently in and will likely lead to ongoing posttraumatic arthritis and further disability at the index finger as time goes along.  From a surgical standpoint, we discussed open reduction internal fixation of the proximal phalanx fracture.  I explained that we will need to take down the interval healing in order to achieve appropriate reduction at the MCP joint with appropriate fixation of the fracture fragments of the proximal phalanx.  I explained that even despite surgical treatment, the risk for posttraumatic arthritis does remain as well as instability of the joint given the chronicity of this problem.  Long-term, we discussed the possibility of MCP arthroplasty versus arthrodesis should we have ongoing symptoms despite surgical fixation.  Given the chronicity of this problem, I did recommend urgent surgical intervention this week in order to help address this problem.  The benefits of this procedure would be to promote fracture healing by providing stability and to heal the fracture in the appropriate alignment. The alternatives of this surgery would be to treat the fracture with immobilization in a splint/brace/cast or to do no intervention. The patient's questions were answered to satisfaction. After this discussion, patient elected to proceed with surgery. Informed consent was obtained.   Risks and benefits of the procedure  were discussed, risks including but not limited to infection, bleeding, scarring, stiffness, nerve  injury, tendon injury, vascular injury, hardware complication, recurrence of symptoms and need for subsequent operation.  Patient expressed understanding.  Will move forward with surgical scheduling of right index finger proximal phalanx intra-articular fracture open reduction internal fixation.   Follow-up: No follow-ups on file.   Meds & Orders: No orders of the defined types were placed in this encounter.   Orders Placed This Encounter  Procedures   Ambulatory referral to Occupational Therapy     Procedures: No procedures performed      Clinical History: No specialty comments available.  She reports that she has never smoked. She has never used smokeless tobacco. No results for input(s): HGBA1C, LABURIC in the last 8760 hours.  Objective:   Vital Signs: LMP 07/24/2024 (Approximate)   Physical Exam  Gen: Well-appearing, in no acute distress; non-toxic CV: Regular Rate. Well-perfused. Warm.  Resp: Breathing unlabored on room air; no wheezing. Psych: Fluid speech in conversation; appropriate affect; normal thought process  Ortho Exam Right hand: - Notable pain, tenderness at the index finger MCP region - Range of motion is limited at the index MCP, 5-25, pain with passive motion, notable instability with translation in the volar/dorsal direction at the index MCP - Hand remains warm well-perfused, sensation intact distally - Remaining digits with appropriate range of motion  Imaging: No results found. Prior hand x-rays and CT of the right hand were reviewed in detail by myself today independently  Past Medical/Family/Surgical/Social History: Medications & Allergies reviewed per EMR, new medications updated. Patient Active Problem List   Diagnosis Date Noted   Delirium tremens (HCC) 03/26/2024   Fever, unspecified 03/26/2024   Liver dysfunction 03/26/2024   Alcohol abuse 03/26/2024   Chronic Right UPJ (ureteropelvic junction) obstruction 03/25/2024   Alcoholic  hepatitis 03/22/2024   Thrombocytopenia 03/22/2024   Affective psychosis 12/22/2023   Eating disorder 12/22/2023   Elevated BP without diagnosis of hypertension 12/22/2023   Family hx-breast malignancy 12/22/2023   Benign neoplasm breast skin 12/22/2023   History of renal stone 12/22/2023   Alcohol use disorder 04/12/2023   Chronic post-traumatic stress disorder (PTSD) 01/31/2022   Cocaine abuse, episodic use (HCC) 01/31/2022   Transaminitis 01/31/2022   Long term prescription benzodiazepine use 01/31/2022   Elevated liver enzymes 09/12/2018   Major depressive disorder 02/08/2018   Moderate alcohol use disorder (HCC)    Insomnia 10/12/2015   Skin lesion of back 07/25/2014   ADD (attention deficit disorder) 03/31/2014   Allergic rhinitis 03/31/2014   Migraine headache with aura 02/15/2013   Past Medical History:  Diagnosis Date   ADHD    Alcohol withdrawal delirium, acute, hyperactive (HCC) 02/05/2018   Allergy    Complicated UTI (urinary tract infection) 04/12/2023   Delirium 02/05/2018   Eczema    Intentional overdose of drug in tablet form (HCC)    Migraines    Overdose 02/05/2018   Sepsis (HCC) 04/12/2023   Family History  Problem Relation Age of Onset   Cancer Mother    Kidney disease Mother    Hypertension Father    Diabetes Paternal Grandfather    Past Surgical History:  Procedure Laterality Date   TONSILLECTOMY     WISDOM TOOTH EXTRACTION     Social History   Occupational History   Not on file  Tobacco Use   Smoking status: Never   Smokeless tobacco: Never  Vaping Use   Vaping status: Never Used  Substance and  Sexual Activity   Alcohol use: Yes    Comment: occas   Drug use: Not Currently    Types: Marijuana    Comment: a few days ago   Sexual activity: Yes    Birth control/protection: None    Cabe Lashley Estela) Twilla Khouri, M.D. [facility], Hand Surgery

## 2024-08-19 ENCOUNTER — Telehealth: Payer: Self-pay | Admitting: Orthopedic Surgery

## 2024-08-19 NOTE — Telephone Encounter (Signed)
 FYI: Per [facility] patient's surgery for tomorrow has been canceled. Patient partied last night and due to what she partied with patient cannot have surgery tomorrow.

## 2024-08-21 ENCOUNTER — Other Ambulatory Visit: Payer: Self-pay | Admitting: Orthopedic Surgery

## 2024-08-23 ENCOUNTER — Encounter: Payer: Self-pay | Admitting: Radiology

## 2024-08-27 ENCOUNTER — Encounter: Admitting: Orthopedic Surgery

## 2024-09-03 ENCOUNTER — Encounter: Admitting: Orthopedic Surgery

## 2024-09-05 ENCOUNTER — Encounter: Admitting: Orthopedic Surgery

## 2024-09-12 ENCOUNTER — Ambulatory Visit: Admitting: Occupational Therapy

## 2024-09-16 ENCOUNTER — Encounter (INDEPENDENT_AMBULATORY_CARE_PROVIDER_SITE_OTHER): Payer: Self-pay | Admitting: Radiology

## 2024-09-17 ENCOUNTER — Ambulatory Visit: Admitting: Occupational Therapy
